# Patient Record
Sex: Male | Born: 1960
Health system: Southern US, Community
[De-identification: ages and names within clinical notes are randomized; demographics above are authoritative.]

## PROBLEM LIST (undated history)

## (undated) DIAGNOSIS — Z5189 Encounter for other specified aftercare: Secondary | ICD-10-CM

## (undated) DIAGNOSIS — J449 Chronic obstructive pulmonary disease, unspecified: Secondary | ICD-10-CM

## (undated) DIAGNOSIS — F1910 Other psychoactive substance abuse, uncomplicated: Secondary | ICD-10-CM

## (undated) DIAGNOSIS — E119 Type 2 diabetes mellitus without complications: Secondary | ICD-10-CM

## (undated) DIAGNOSIS — I10 Essential (primary) hypertension: Secondary | ICD-10-CM

## (undated) DIAGNOSIS — K219 Gastro-esophageal reflux disease without esophagitis: Secondary | ICD-10-CM

## (undated) DIAGNOSIS — M1990 Unspecified osteoarthritis, unspecified site: Secondary | ICD-10-CM

## (undated) DIAGNOSIS — G4733 Obstructive sleep apnea (adult) (pediatric): Secondary | ICD-10-CM

## (undated) DIAGNOSIS — M5136 Other intervertebral disc degeneration, lumbar region: Secondary | ICD-10-CM

## (undated) DIAGNOSIS — Z720 Tobacco use: Secondary | ICD-10-CM

## (undated) DIAGNOSIS — R0789 Other chest pain: Secondary | ICD-10-CM

## (undated) DIAGNOSIS — J069 Acute upper respiratory infection, unspecified: Secondary | ICD-10-CM

## (undated) DIAGNOSIS — J45909 Unspecified asthma, uncomplicated: Secondary | ICD-10-CM

## (undated) DIAGNOSIS — J939 Pneumothorax, unspecified: Secondary | ICD-10-CM

## (undated) DIAGNOSIS — T7840XA Allergy, unspecified, initial encounter: Secondary | ICD-10-CM

## (undated) DIAGNOSIS — L509 Urticaria, unspecified: Secondary | ICD-10-CM

## (undated) DIAGNOSIS — E785 Hyperlipidemia, unspecified: Secondary | ICD-10-CM

## (undated) DIAGNOSIS — I509 Heart failure, unspecified: Secondary | ICD-10-CM

## (undated) HISTORY — DX: Other intervertebral disc degeneration, lumbar region: M51.36

## (undated) HISTORY — DX: Heart failure, unspecified: I50.9

## (undated) HISTORY — DX: Gastro-esophageal reflux disease without esophagitis: K21.9

## (undated) HISTORY — DX: Allergy, unspecified, initial encounter: T78.40XA

## (undated) HISTORY — DX: Unspecified osteoarthritis, unspecified site: M19.90

## (undated) HISTORY — DX: Unspecified asthma, uncomplicated: J45.909

## (undated) HISTORY — DX: Other psychoactive substance abuse, uncomplicated: F19.10

## (undated) HISTORY — DX: Hyperlipidemia, unspecified: E78.5

## (undated) HISTORY — DX: Pneumothorax, unspecified: J93.9

## (undated) HISTORY — DX: Obstructive sleep apnea (adult) (pediatric): G47.33

## (undated) HISTORY — DX: Urticaria, unspecified: L50.9

## (undated) HISTORY — DX: Tobacco use: Z72.0

## (undated) HISTORY — DX: Other chest pain: R07.89

## (undated) HISTORY — PX: CHEST TUBE INSERTION: SHX231

## (undated) HISTORY — DX: Essential (primary) hypertension: I10

## (undated) HISTORY — DX: Acute upper respiratory infection, unspecified: J06.9

## (undated) HISTORY — DX: Encounter for other specified aftercare: Z51.89

## (undated) HISTORY — DX: Type 2 diabetes mellitus without complications: E11.9

## (undated) HISTORY — DX: Chronic obstructive pulmonary disease, unspecified: J44.9

---

## 2005-06-05 ENCOUNTER — Ambulatory Visit: Payer: Self-pay | Admitting: Cardiology

## 2006-09-18 ENCOUNTER — Ambulatory Visit: Payer: Self-pay | Admitting: Cardiology

## 2006-09-19 ENCOUNTER — Inpatient Hospital Stay (HOSPITAL_COMMUNITY): Admission: AD | Admit: 2006-09-19 | Discharge: 2006-09-22 | Payer: Self-pay | Admitting: Cardiology

## 2006-09-19 ENCOUNTER — Ambulatory Visit: Payer: Self-pay | Admitting: Cardiology

## 2007-07-06 ENCOUNTER — Ambulatory Visit: Payer: Self-pay | Admitting: Cardiology
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
## 2010-05-22 NOTE — Cardiovascular Report (Signed)
NAME:  Edward Burton, Edward Burton NO.:  [id number]   MEDICAL RECORD NO.:  [id number]          PATIENT TYPE:  INP   LOCATION:  2028                         FACILITY:  [facility]   PHYSICIAN:  Veverly Fells. Excell Seltzer, MD  DATE OF BIRTH:  [birth year]   DATE OF PROCEDURE:  DATE OF DISCHARGE:                            CARDIAC CATHETERIZATION   PROCEDURE:  Left heart catheterization, selective coronary angiography,  left ventricular angiography, StarClose of the right femoral artery.   INDICATION:  Edward Burton is a 50 year old obese gentleman who presented  with chest pain.  He was initially evaluated at [facility].  He has  multiple cardiac risk factors including ongoing tobacco and obesity with  hypertension in the background.  He underwent a Myoview study that  showed inferior ischemia.  He was referred for cardiac catheterization.   Risks and indications of the procedure were reviewed with the patient.  Informed consent was obtained.  The right groin was accessed using the  modified Seldinger technique and a 6-French sheath was placed.  Multiple  views of the coronaries were taken using standard Judkins catheters.  A  pigtail catheter was inserted into the left ventricle where pressures  were recorded.  A left ventriculogram was done.  A pullback across the  aortic valve was performed.  At the completion of the procedure a  StarClose device was used to seal the femoral arteriotomy.  There were  no immediate complications.   FINDINGS:  Aortic pressure 127/83 with a mean of 102, left ventricular  pressure 125/17.  There was no aortic valve gradient.   CORONARY ANGIOGRAPHY:  The left mainstem is angiographically normal.  It  trifurcates into the LAD, intermediate branch, and left circumflex.   The LAD is a large-caliber vessel that wraps around the LV apex.  The  LAD courses down and supplies two septal perforators and one medium-size  diagonal branch.  There is no significant  angiographic stenosis  throughout the LAD.  The LAD wraps around the apex and supplies much of  the inferoapical wall.   There is a very large intermediate branch that is widely patent.  It  bifurcates into twin vessels and supplies much of the inferolateral  wall.   The left circumflex is small.  There is a very small first OM and a  small second OM branch.  There is no significant angiographic stenosis.   The right coronary artery is dominant but the PDA is small.  There is  also a small right posterolateral branch.  There is an RV marginal  branch that originates from the mid portion of the right coronary  artery.  There is no significant angiographic stenosis throughout the  right coronary artery.   Left ventricular function is at the low levels of normal.  The LVEF is  estimated at 50-55%.  There is no mitral regurgitation.   ASSESSMENT:  1. Normal coronary arteries.  2. Low-normal left ventricular systolic function.   Edward Burton likely had noncardiac chest pain.  He will benefit from  primary risk reduction with weight loss and smoking cessation.  We will  plan on discharge  home later today.      Veverly Fells. Excell Seltzer, MD  Electronically Signed     MDC/MEDQ  D:  09/22/2006  T:  09/22/2006  Job:  [id number]

## 2010-10-19 LAB — CBC
MCHC: 33.2
MCV: 84.8
RBC: 4.4

## 2011-07-12 DIAGNOSIS — E1169 Type 2 diabetes mellitus with other specified complication: Secondary | ICD-10-CM | POA: Insufficient documentation

## 2011-07-12 DIAGNOSIS — E785 Hyperlipidemia, unspecified: Secondary | ICD-10-CM | POA: Insufficient documentation

## 2011-09-28 DIAGNOSIS — F419 Anxiety disorder, unspecified: Secondary | ICD-10-CM | POA: Insufficient documentation

## 2012-04-14 DIAGNOSIS — R079 Chest pain, unspecified: Secondary | ICD-10-CM

## 2012-04-20 ENCOUNTER — Encounter: Payer: Self-pay | Admitting: Cardiology

## 2012-04-23 ENCOUNTER — Encounter: Payer: Self-pay | Admitting: *Deleted

## 2012-04-23 ENCOUNTER — Encounter: Payer: Self-pay | Admitting: Physician Assistant

## 2012-04-23 NOTE — Progress Notes (Signed)
Patient walked into office saying he forgot about his 1:40 pm appointment today and wanted to be seen now for chest pain. Patient stated he was actively having chest pain. Patient said that he didn't take nitro because it makes him feel worse. Nurse asked patient what number her rated his pain on a scale of 1-10 ( 10 being the greatest) and patient said he didn't know. Nurse informed patient that no provider was here to see him in the office now and he needed to go to ED for evaluation. Patient stated that he just felt so bad, and nurse offered to call EMS and patient declined. Nurse offered to patient to reschedule his missed appointment and patient declined on that also.
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
## 2012-04-23 NOTE — Telephone Encounter (Signed)
This encounter was created in error - please disregard.

## 2013-03-15 ENCOUNTER — Encounter (HOSPITAL_COMMUNITY): Payer: Self-pay | Admitting: Emergency Medicine

## 2013-03-15 ENCOUNTER — Emergency Department (HOSPITAL_COMMUNITY)
Admission: EM | Admit: 2013-03-15 | Discharge: 2013-03-15 | Disposition: A | Discharge status: Home / Self Care | Payer: PRIVATE HEALTH INSURANCE | Attending: Emergency Medicine | Admitting: Emergency Medicine

## 2013-03-15 ENCOUNTER — Emergency Department (HOSPITAL_COMMUNITY): Payer: PRIVATE HEALTH INSURANCE

## 2013-03-15 DIAGNOSIS — Z8719 Personal history of other diseases of the digestive system: Secondary | ICD-10-CM | POA: Insufficient documentation

## 2013-03-15 DIAGNOSIS — J069 Acute upper respiratory infection, unspecified: Secondary | ICD-10-CM | POA: Insufficient documentation

## 2013-03-15 DIAGNOSIS — Z880 Allergy status to penicillin: Secondary | ICD-10-CM | POA: Insufficient documentation

## 2013-03-15 DIAGNOSIS — I10 Essential (primary) hypertension: Secondary | ICD-10-CM | POA: Insufficient documentation

## 2013-03-15 DIAGNOSIS — F17200 Nicotine dependence, unspecified, uncomplicated: Secondary | ICD-10-CM | POA: Insufficient documentation

## 2013-03-15 DIAGNOSIS — N390 Urinary tract infection, site not specified: Secondary | ICD-10-CM | POA: Insufficient documentation

## 2013-03-15 DIAGNOSIS — Z9889 Other specified postprocedural states: Secondary | ICD-10-CM | POA: Insufficient documentation

## 2013-03-15 DIAGNOSIS — E119 Type 2 diabetes mellitus without complications: Secondary | ICD-10-CM | POA: Insufficient documentation

## 2013-03-15 LAB — COMPREHENSIVE METABOLIC PANEL
ALK PHOS: 55 U/L (ref 39–117)
ALT: 17 U/L (ref 0–53)
AST: 23 U/L (ref 0–37)
Albumin: 3.7 g/dL (ref 3.5–5.2)
BILIRUBIN TOTAL: 0.6 mg/dL (ref 0.3–1.2)
BUN: 17 mg/dL (ref 6–23)
CHLORIDE: 104 meq/L (ref 96–112)
CO2: 29 meq/L (ref 19–32)
Calcium: 9.1 mg/dL (ref 8.4–10.5)
Creatinine, Ser: 1.05 mg/dL (ref 0.50–1.35)
GFR, EST NON AFRICAN AMERICAN: 80 mL/min — AB (ref >90)
GLUCOSE: 89 mg/dL (ref 70–99)
POTASSIUM: 3.8 meq/L (ref 3.7–5.3)
SODIUM: 142 meq/L (ref 137–147)
TOTAL PROTEIN: 7.5 g/dL (ref 6.0–8.3)

## 2013-03-15 LAB — CBC WITH DIFFERENTIAL/PLATELET
Basophils Absolute: 0 10*3/uL (ref 0.0–0.1)
Basophils Relative: 0 % (ref 0–1)
Eosinophils Absolute: 0.1 10*3/uL (ref 0.0–0.7)
Eosinophils Relative: 4 % (ref 0–5)
HCT: 41.7 % (ref 39.0–52.0)
HEMOGLOBIN: 13.6 g/dL (ref 13.0–17.0)
LYMPHS ABS: 1.4 10*3/uL (ref 0.7–4.0)
LYMPHS PCT: 35 % (ref 12–46)
MCH: 28.9 pg (ref 26.0–34.0)
MCHC: 32.6 g/dL (ref 30.0–36.0)
MCV: 88.5 fL (ref 78.0–100.0)
MONOS PCT: 9 % (ref 3–12)
Monocytes Absolute: 0.4 10*3/uL (ref 0.1–1.0)
NEUTROS ABS: 2.1 10*3/uL (ref 1.7–7.7)
NEUTROS PCT: 52 % (ref 43–77)
PLATELETS: 153 10*3/uL (ref 150–400)
RBC: 4.71 MIL/uL (ref 4.22–5.81)
RDW: 13.5 % (ref 11.5–15.5)
WBC: 4 10*3/uL (ref 4.0–10.5)

## 2013-03-15 LAB — URINALYSIS, ROUTINE W REFLEX MICROSCOPIC
Glucose, UA: NEGATIVE mg/dL
HGB URINE DIPSTICK: NEGATIVE
Ketones, ur: NEGATIVE mg/dL
Leukocytes, UA: NEGATIVE
NITRITE: NEGATIVE
PH: 6 (ref 5.0–8.0)
UROBILINOGEN UA: 0.2 mg/dL (ref 0.0–1.0)

## 2013-03-15 LAB — URINE MICROSCOPIC-ADD ON

## 2013-03-15 MED ORDER — CIPROFLOXACIN HCL 500 MG PO TABS: 500.0000 mg | ORAL_TABLET | Freq: Two times a day (BID) | ORAL | Status: DC

## 2013-03-15 NOTE — Discharge Instructions (Signed)
Follow up next week for recheck if not improving.

## 2013-03-15 NOTE — ED Notes (Signed)
Pt alert & oriented x4, stable gait. Patient given discharge instructions, paperwork & prescription(s). Patient  instructed to stop at the registration desk to finish any additional paperwork. Patient verbalized understanding. Pt left department w/ no further questions. 
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
## 2013-03-15 NOTE — ED Notes (Signed)
Pt states pain to lower abdomen, lower back, and groin area, worse with urination. Pt also states urinary incontinence at times. Symptoms x 2-3 weeks, stating he first thought he had the flu. Pt states he stopped taking blood pressure medication and metformin a year ago on his own.

## 2013-03-24 ENCOUNTER — Emergency Department (HOSPITAL_COMMUNITY): Payer: PRIVATE HEALTH INSURANCE

## 2013-03-24 ENCOUNTER — Encounter (HOSPITAL_COMMUNITY): Payer: Self-pay | Admitting: Emergency Medicine

## 2013-03-24 ENCOUNTER — Emergency Department (HOSPITAL_COMMUNITY)
Admission: EM | Admit: 2013-03-24 | Discharge: 2013-03-24 | Disposition: A | Discharge status: Home / Self Care | Payer: PRIVATE HEALTH INSURANCE | Attending: Emergency Medicine | Admitting: Emergency Medicine

## 2013-03-24 DIAGNOSIS — Z79899 Other long term (current) drug therapy: Secondary | ICD-10-CM | POA: Insufficient documentation

## 2013-03-24 DIAGNOSIS — J449 Chronic obstructive pulmonary disease, unspecified: Secondary | ICD-10-CM

## 2013-03-24 DIAGNOSIS — I10 Essential (primary) hypertension: Secondary | ICD-10-CM | POA: Insufficient documentation

## 2013-03-24 DIAGNOSIS — R3919 Other difficulties with micturition: Secondary | ICD-10-CM | POA: Insufficient documentation

## 2013-03-24 DIAGNOSIS — E119 Type 2 diabetes mellitus without complications: Secondary | ICD-10-CM | POA: Insufficient documentation

## 2013-03-24 DIAGNOSIS — Z8719 Personal history of other diseases of the digestive system: Secondary | ICD-10-CM | POA: Insufficient documentation

## 2013-03-24 DIAGNOSIS — Z880 Allergy status to penicillin: Secondary | ICD-10-CM | POA: Insufficient documentation

## 2013-03-24 DIAGNOSIS — M549 Dorsalgia, unspecified: Secondary | ICD-10-CM | POA: Insufficient documentation

## 2013-03-24 DIAGNOSIS — J441 Chronic obstructive pulmonary disease with (acute) exacerbation: Secondary | ICD-10-CM | POA: Insufficient documentation

## 2013-03-24 DIAGNOSIS — F17200 Nicotine dependence, unspecified, uncomplicated: Secondary | ICD-10-CM | POA: Insufficient documentation

## 2013-03-24 LAB — URINALYSIS, ROUTINE W REFLEX MICROSCOPIC
BILIRUBIN URINE: NEGATIVE
GLUCOSE, UA: NEGATIVE mg/dL
HGB URINE DIPSTICK: NEGATIVE
Ketones, ur: NEGATIVE mg/dL
Leukocytes, UA: NEGATIVE
Nitrite: NEGATIVE
PROTEIN: NEGATIVE mg/dL
Specific Gravity, Urine: 1.025 (ref 1.005–1.030)
Urobilinogen, UA: 0.2 mg/dL (ref 0.0–1.0)
pH: 6.5 (ref 5.0–8.0)

## 2013-03-24 MED ORDER — PREDNISONE 50 MG PO TABS
60.0000 mg | ORAL_TABLET | Freq: Once | ORAL | Status: AC
  Administered 2013-03-24: 60 mg via ORAL
  Filled 2013-03-24 (×2): qty 1

## 2013-03-24 MED ORDER — PREDNISONE 10 MG PO TABS: 40.0000 mg | ORAL_TABLET | Freq: Every day | ORAL | Status: DC

## 2013-03-24 MED ORDER — IPRATROPIUM-ALBUTEROL 0.5-2.5 (3) MG/3ML IN SOLN: 3.0000 mL | Freq: Once | RESPIRATORY_TRACT | Status: DC

## 2013-03-24 MED ORDER — ALBUTEROL SULFATE HFA 108 (90 BASE) MCG/ACT IN AERS
2.0000 | INHALATION_SPRAY | Freq: Four times a day (QID) | RESPIRATORY_TRACT | Status: DC
  Filled 2013-03-24: qty 6.7

## 2013-03-24 MED ORDER — HYDROMORPHONE HCL PF 1 MG/ML IJ SOLN: 0.5000 mg | Freq: Once | INTRAMUSCULAR | Status: DC

## 2013-03-24 MED ORDER — VANCOMYCIN HCL IN DEXTROSE 1-5 GM/200ML-% IV SOLN: 1000.0000 mg | Freq: Once | INTRAVENOUS | Status: DC

## 2013-03-24 MED ORDER — IPRATROPIUM-ALBUTEROL 0.5-2.5 (3) MG/3ML IN SOLN
3.0000 mL | Freq: Once | RESPIRATORY_TRACT | Status: AC
  Administered 2013-03-24: 3 mL via RESPIRATORY_TRACT
  Filled 2013-03-24: qty 3

## 2013-03-24 MED ORDER — AZTREONAM 2 G IJ SOLR: 2.0000 g | Freq: Once | INTRAMUSCULAR | Status: DC

## 2013-03-24 NOTE — Discharge Instructions (Signed)
Asthma, Adult Asthma is a condition of the lungs in which the airways tighten and narrow. Asthma can make it hard to breathe. Asthma cannot be cured, but medicine and lifestyle changes can help control it. Asthma may be started (triggered) by:  Animal skin flakes (dander).  Dust.  Cockroaches.  Pollen.  Mold.  Smoke.  Cleaning products.  Hair sprays or aerosol sprays.  Paint fumes or strong smells.  Cold air, weather changes, and winds.  Crying or laughing hard.  Stress.  Certain medicines or drugs.  Foods, such as dried fruit, potato chips, and sparkling grape juice.  Infections or conditions (colds, flu).  Exercise.  Certain medical conditions or diseases.  Exercise or tiring activities. HOME CARE   Take medicine as told by your doctor.  Use a peak flow meter as told by your doctor. A peak flow meter is a tool that measures how well the lungs are working.  Record and keep track of the peak flow meter's readings.  Understand and use the asthma action plan. An asthma action plan is a written plan for taking care of your asthma and treating your attacks.  To help prevent asthma attacks:  Do not smoke. Stay away from secondhand smoke.  Change your heating and air conditioning filter often.  Limit your use of fireplaces and wood stoves.  Get rid of pests (such as roaches and mice) and their droppings.  Throw away plants if you see mold on them.  Clean your floors. Dust regularly. Use cleaning products that do not smell.  Have someone vacuum when you are not home. Use a vacuum cleaner with a HEPA filter if possible.  Replace carpet with wood, tile, or vinyl flooring. Carpet can trap animal skin flakes and dust.  Use allergy-proof pillows, mattress covers, and box spring covers.  Wash bed sheets and blankets every week in hot water and dry them in a dryer.  Use blankets that are made of polyester or cotton.  Clean bathrooms and kitchens with bleach.  If possible, have someone repaint the walls in these rooms with mold-resistant paint. Keep out of the rooms that are being cleaned and painted.  Wash hands often. GET HELP IF:  You have make a whistling sound when breaking (wheeze), have shortness of breath, or have a cough even if taking medicine to prevent attacks.  The colored mucus you cough up (sputum) is thicker than usual.  The colored mucus you cough up changes from clear or white to yellow, green, gray, or bloody.  You have problems from the medicine you are taking such as:  A rash.  Itching.  Swelling.  Trouble breathing.  You need reliever medicines more than 2 3 times a week.  Your peak flow measurement is still at 50 79% of your personal best after following the action plan for 1 hour. GET HELP RIGHT AWAY IF:   You seem to be worse and are not responding to medicine during an asthma attack.  You are short of breath even at rest.  You get short of breath when doing very little activity.  You have trouble eating, drinking, or talking.  You have chest pain.  You have a fast heartbeat.  Your lips or fingernails start to turn blue.  You are lightheaded, dizzy, or faint.  Your peak flow is less than 50% of your personal best.  You have a fever or lasting symptoms for more than 2 3 days.  You have a fever and your symptoms suddenly  get worse. MAKE SURE YOU:   Understand these instructions.  Will watch your condition.  Will get help right away if you are not doing well or get worse. Document Released: 06/12/2007 Document Revised: 10/14/2012 Document Reviewed: 07/23/2012 [facility] Patient Information 2014 [place], [place].   Emergency Department Resource Guide 1) Find a Doctor and Pay Out of Pocket Although you won't have to find out who is covered by your insurance plan, it is a good idea to ask around and get recommendations. You will then need to call the office and see if the doctor you have chosen  will accept you as a new patient and what types of options they offer for patients who are self-pay. Some doctors offer discounts or will set up payment plans for their patients who do not have insurance, but you will need to ask so you aren't surprised when you get to your appointment.  2) Contact Your Local Health Department Not all health departments have doctors that can see patients for sick visits, but many do, so it is worth a call to see if yours does. If you don't know where your local health department is, you can check in your phone book. The CDC also has a tool to help you locate your state's health department, and many state websites also have listings of all of their local health departments.  3) Find a [facility] If your illness is not likely to be very severe or complicated, you may want to try a walk in clinic. These are popping up all over the country in pharmacies, drugstores, and shopping centers. They're usually staffed by nurse practitioners or physician assistants that have been trained to treat common illnesses and complaints. They're usually fairly quick and inexpensive. However, if you have serious medical issues or chronic medical problems, these are probably not your best option.  No Primary Care Doctor: - Call Health Connect at  [phone] - they can help you locate a primary care doctor that  accepts your insurance, provides certain services, etc. - Physician Referral Service- [phone]  Chronic Pain Problems: Organization         Address  Phone   Notes  [facility]  [phone] Patients need to be referred by their primary care doctor.   Medication Assistance: Organization         Address  Phone   Notes  [facility] Medication [facility] [address] [place], [place] [address] [phone] --Must be a resident of [facility] -- Must have NO insurance coverage whatsoever (no Medicaid/ Medicare, etc.) --  The pt. MUST have a primary care doctor that directs their care regularly and follows them in the community   Med[facility]ist  [phone]   [employer]  [phone]    Agencies that provide inexpensive medical care: Organization         Address  Phone   Notes  [place]  [phone]   [facility]    [phone]   [facility] [place], [place] [address] [phone]   [place] [address], [place] [phone]   Planned Parenthood    [phone]   [facility]    [phone]   [place] and [place] [address], [place] Phone:  [phone], Fax:  [phone] Hours of Operation:  9 am - 6 pm,  M-F.  Also accepts Medicaid/Medicare and self-pay.  [facility] for [address] [place], [address], [place] Phone: [phone], Fax: [phone]. Hours of Operation:  8:30 am - 5:30 pm, M-F.  Also accepts Medicaid and self-pay.  [facility] [address] [place] Phone: [phone]   [place], [place], [place] [phone], Ext. [phone] Mondays & Thursdays: 7-9 AM.  First 15 patients are seen on a first come, first serve basis.    [place] Providers:  Organization         Address  Phone   Notes  [facility] [address], [place] [phone] Also accepts self-pay patients.  [facility] [id number] [place], [place]  [phone]   [place], [address], [place] [phone]   [facility] [address], [place] [phone]   Lucianne Lei [address], [place]   [phone] Only accepts [place] Access [place] patients after they have their name applied to their card.   Self-Pay (no insurance)  in [facility]:  Organization         Address  Phone   Notes  Sickle Cell Patients, [facility] [place] [phone]   [facility] [place] [phone]   [facility] [place]  [place], [place], [place] [phone]   [facility]/Dr. Osei-Bonsu  [address] [place] or [address], [place] [phone] Phone number for both [facility] and [place] locations is the same.  Urgent Medical and [facility] [address] [place] [phone]   [facility] [address] [place] or [address] [phone]   [facility] [address] [place] [phone], phone; [phone], fax Sees patients 1st and 3rd Saturday of every month.  Must not qualify for public or private insurance (i.e. Medicaid, Medicare, [place] Health Choice, Veterans' Benefits)  Household income should be no more than 200% of the poverty level The clinic cannot treat you if you are pregnant or think you are pregnant  Sexually transmitted diseases are not treated at the clinic.    Dental Care: Organization         Address  Phone  Notes  [facility] of [facility] [place] [phone] Accepts children up to age 66 who are enrolled in [place] or [place]; pregnant women with a Medicaid card; and children who have applied for Medicaid or [place] Health Choice, but were declined, whose parents can pay a reduced fee at time of service.  [facility]  [address], [place] [phone] Accepts children up to age 46 who are enrolled in [place] or [place]; pregnant women with a Medicaid card; and children who have applied for Medicaid or [place] Health Choice, but were declined, whose  parents can pay a reduced fee at time of service.  [facility] Adult Dental Access PROGRAM  [place] [phone] Patients are seen by appointment only. Walk-ins are not accepted. [place] will see patients 65 years of age and older. Monday -  Tuesday (8am-5pm) Most Wednesdays (8:30-5pm) $30 per visit, cash only  [facility] Adult Dental Access PROGRAM  [address], [facility] [phone] Patients are seen by appointment only. Walk-ins are not accepted. [place] will see patients 56 years of age and older. One Wednesday Evening (Monthly: Volunteer Based).  $30 per visit, cash only  [place]  [phone] for adults; Children under age 4, call Graduate Pediatric Dentistry at [phone]. Children aged 74-14, please call [phone] to request a pediatric application.  Dental services are provided in all areas of dental care including fillings, crowns and bridges, complete and partial dentures, implants, gum treatment, root canals, and extractions. Preventive care is also provided. Treatment is provided to both adults and children. Patients are selected via a lottery and there is often a waiting list.   [facility] [address] [place]  [phone] [email or web address]   [facility] [address] [place], [place] [phone], Ext. [phone] Second and Fourth Thursday of each month, opens at 6:30 AM; Clinic ends at 9 AM.  Patients are seen on a first-come first-served basis, and a limited number are seen during each clinic.   [facility]  [address] Hillard Danker [place], [place] [phone]   Eligibility Requirements You must have lived in [place], [place], or [place] counties for at least the last three months.   You cannot be eligible for state or federal sponsored [employer], including [employer], [place], or [employer].   You generally cannot be eligible for  healthcare insurance through your employer.    How to apply: Eligibility screenings are held every Tuesday and Wednesday afternoon from 1:00 pm until 4:00 pm. You do not need an appointment for the interview!  [facility] [address], [place], [place]   [place]  [place] Department  [place]  [phone]    Behavioral Health Resources in the Community: Intensive Outpatient Programs Organization         Address  Phone  Notes  [place] [place], [place] [phone]   [facility] Outpatient [address], [place], [place]   ADS: Alcohol & Drug Svcs [address] [place], [place]   [place] [address],  [place], [place] or [phone]   Substance Abuse Resources Organization         Address  Phone  Notes  Alcohol and Drug Services  [phone]   [place]  [phone]   [facility] [place]   Chinita Pester  [phone]   Residential & Outpatient Substance Abuse Program  [phone]   Psychological Services Organization         Address  Phone  Notes  [facility]  [place]  [phone]   [place] [address], [place] or [phone]    Mobile Crisis Teams Organization         Address  Phone  Notes  [facility], Mobile Crisis Care Unit  [phone]   [facility]  [address] [place], [place]   [place] [address] [place] [phone]    Self-Help/Support Groups Organization         Address  Phone  Notes  Mental Health Assoc. of [place] - variety of support groups  [place] Call for more information  Narcotics  Anonymous (NA), [facility]aring [facility] [address], [employer] [place]  2 meetings at this location   Special educational needs teacher         Address  Phone  Notes  [facility] Residential Treatment [place],    [place]  [phone]   [facility]  [address] [place] [address]191, [place], [place]   [place], [place] [phone] Admissions: 8am-3pm M-F  Incentives Substance [place] [address],    [place], [place] [phone]   [facility] [address] [place], [place], [place]   [facility] [address],  [place], [place]   [facility] Programs - Intensive Outpatient [address]een [address], [place], [place]   [facility] [place].,  [place], [place] [phone] or [phone]   [facility] Services (RTS) [address] [place], [place] Accepts Medicaid  [facility] [place] [address],  [place] [phone] Substance Abuse/Addiction Treatment   [facility] Organization         Address  Phone  Notes  [facility]  [phone]   Domenic Schwab, PhD [address] Arlis Porta [place], [place]   [phone] or [phone]   [place], [place] [phone]   [facility] [address] [place], [place] [phone] Insurance/Medicaid/sponsorship through [facility] and Families [address]                                    [place], [place] [phone] [place] [address], [place] [phone]    Dr. Adele Schilder  [phone]   [facility] Dept. 1) [address] [place] 2) [place] 3)  [place] [address], [place] [phone]  [phone]   [place] [phone] or [phone] (After Hours)     Use resource guide above to help find a record Dr. Usual albuterol inhaler 2 puffs every 6 hours next 7 days then as needed for shortness of breath. Take prednisone for the next 5 days. Return for any new or worse symptoms.

## 2013-03-24 NOTE — ED Notes (Signed)
Poor historian states he has trouble breathing and urinary problems
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
## 2013-03-24 NOTE — ED Notes (Signed)
Patient walking around hall in front of his room.  Patient states he feels better.  Patient awaiting discharge.

## 2013-08-20 ENCOUNTER — Encounter (HOSPITAL_COMMUNITY): Payer: Self-pay | Admitting: Emergency Medicine

## 2013-08-20 ENCOUNTER — Emergency Department (HOSPITAL_COMMUNITY): Payer: PRIVATE HEALTH INSURANCE

## 2013-08-20 ENCOUNTER — Emergency Department (HOSPITAL_COMMUNITY)
Admission: EM | Admit: 2013-08-20 | Discharge: 2013-08-20 | Disposition: A | Discharge status: Home / Self Care | Payer: PRIVATE HEALTH INSURANCE | Attending: Emergency Medicine | Admitting: Emergency Medicine

## 2013-08-20 DIAGNOSIS — I10 Essential (primary) hypertension: Secondary | ICD-10-CM | POA: Insufficient documentation

## 2013-08-20 DIAGNOSIS — Z8719 Personal history of other diseases of the digestive system: Secondary | ICD-10-CM | POA: Diagnosis not present

## 2013-08-20 DIAGNOSIS — R079 Chest pain, unspecified: Secondary | ICD-10-CM | POA: Diagnosis present

## 2013-08-20 DIAGNOSIS — R0789 Other chest pain: Secondary | ICD-10-CM

## 2013-08-20 DIAGNOSIS — R071 Chest pain on breathing: Secondary | ICD-10-CM | POA: Insufficient documentation

## 2013-08-20 DIAGNOSIS — Z8709 Personal history of other diseases of the respiratory system: Secondary | ICD-10-CM | POA: Diagnosis not present

## 2013-08-20 DIAGNOSIS — Z7982 Long term (current) use of aspirin: Secondary | ICD-10-CM | POA: Insufficient documentation

## 2013-08-20 DIAGNOSIS — Z79899 Other long term (current) drug therapy: Secondary | ICD-10-CM | POA: Insufficient documentation

## 2013-08-20 DIAGNOSIS — Z880 Allergy status to penicillin: Secondary | ICD-10-CM | POA: Insufficient documentation

## 2013-08-20 DIAGNOSIS — E119 Type 2 diabetes mellitus without complications: Secondary | ICD-10-CM | POA: Diagnosis not present

## 2013-08-20 DIAGNOSIS — F17200 Nicotine dependence, unspecified, uncomplicated: Secondary | ICD-10-CM | POA: Insufficient documentation

## 2013-08-20 DIAGNOSIS — Z87828 Personal history of other (healed) physical injury and trauma: Secondary | ICD-10-CM | POA: Insufficient documentation

## 2013-08-20 LAB — CBC WITH DIFFERENTIAL/PLATELET
BASOS PCT: 0 % (ref 0–1)
Basophils Absolute: 0 10*3/uL (ref 0.0–0.1)
Eosinophils Absolute: 0.1 10*3/uL (ref 0.0–0.7)
Eosinophils Relative: 2 % (ref 0–5)
HEMATOCRIT: 43.2 % (ref 39.0–52.0)
HEMOGLOBIN: 13.9 g/dL (ref 13.0–17.0)
LYMPHS PCT: 32 % (ref 12–46)
Lymphs Abs: 1.9 10*3/uL (ref 0.7–4.0)
MCH: 28.7 pg (ref 26.0–34.0)
MCHC: 32.2 g/dL (ref 30.0–36.0)
MCV: 89.3 fL (ref 78.0–100.0)
MONO ABS: 0.4 10*3/uL (ref 0.1–1.0)
MONOS PCT: 7 % (ref 3–12)
NEUTROS ABS: 3.5 10*3/uL (ref 1.7–7.7)
Neutrophils Relative %: 59 % (ref 43–77)
Platelets: 168 10*3/uL (ref 150–400)
RBC: 4.84 MIL/uL (ref 4.22–5.81)
RDW: 13.6 % (ref 11.5–15.5)
WBC: 6 10*3/uL (ref 4.0–10.5)

## 2013-08-20 LAB — BASIC METABOLIC PANEL
Anion gap: 9 (ref 5–15)
BUN: 13 mg/dL (ref 6–23)
CHLORIDE: 106 meq/L (ref 96–112)
CO2: 27 meq/L (ref 19–32)
CREATININE: 1.09 mg/dL (ref 0.50–1.35)
Calcium: 8.6 mg/dL (ref 8.4–10.5)
GFR calc Af Amer: 88 mL/min — ABNORMAL LOW (ref >90)
GFR calc non Af Amer: 76 mL/min — ABNORMAL LOW (ref >90)
Glucose, Bld: 85 mg/dL (ref 70–99)
Potassium: 4 mEq/L (ref 3.7–5.3)
Sodium: 142 mEq/L (ref 137–147)

## 2013-08-20 LAB — TROPONIN I

## 2013-08-20 LAB — D-DIMER, QUANTITATIVE (NOT AT ARMC): D-Dimer, Quant: 1.02 ug/mL-FEU — ABNORMAL HIGH (ref 0.00–0.48)

## 2013-08-20 MED ORDER — ONDANSETRON HCL 4 MG/2ML IJ SOLN
4.0000 mg | Freq: Once | INTRAMUSCULAR | Status: DC
  Filled 2013-08-20: qty 2

## 2013-08-20 MED ORDER — HYDROCODONE-ACETAMINOPHEN 5-325 MG PO TABS: 2.0000 | ORAL_TABLET | ORAL | Status: DC | PRN

## 2013-08-20 MED ORDER — ONDANSETRON HCL 4 MG/2ML IJ SOLN
4.0000 mg | Freq: Once | INTRAMUSCULAR | Status: AC
  Administered 2013-08-20: 4 mg via INTRAVENOUS

## 2013-08-20 MED ORDER — IOHEXOL 350 MG/ML SOLN
100.0000 mL | Freq: Once | INTRAVENOUS | Status: AC | PRN
  Administered 2013-08-20: 100 mL via INTRAVENOUS

## 2013-08-20 MED ORDER — HYDROCODONE-ACETAMINOPHEN 5-325 MG PO TABS: 1.0000 | ORAL_TABLET | ORAL | Status: DC | PRN

## 2013-08-20 MED ORDER — MORPHINE SULFATE 4 MG/ML IJ SOLN
4.0000 mg | Freq: Once | INTRAMUSCULAR | Status: AC
  Administered 2013-08-20: 4 mg via INTRAVENOUS
  Filled 2013-08-20: qty 1

## 2013-08-20 NOTE — Discharge Instructions (Signed)
Chest Wall Pain °Chest wall pain is pain in or around the bones and muscles of your chest. It may take up to 6 weeks to get better. It may take longer if you must stay physically active in your work and activities.  °CAUSES  °Chest wall pain may happen on its own. However, it may be caused by: °· A viral illness like the flu. °· Injury. °· Coughing. °· Exercise. °· Arthritis. °· Fibromyalgia. °· Shingles. °HOME CARE INSTRUCTIONS  °· Avoid overtiring physical activity. Try not to strain or perform activities that cause pain. This includes any activities using your chest or your abdominal and side muscles, especially if heavy weights are used. °· Put ice on the sore area. °¨ Put ice in a plastic bag. °¨ Place a towel between your skin and the bag. °¨ Leave the ice on for 15-20 minutes per hour while awake for the first 2 days. °· Only take over-the-counter or prescription medicines for pain, discomfort, or fever as directed by your caregiver. °SEEK IMMEDIATE MEDICAL CARE IF:  °· Your pain increases, or you are very uncomfortable. °· You have a fever. °· Your chest pain becomes worse. °· You have new, unexplained symptoms. °· You have nausea or vomiting. °· You feel sweaty or lightheaded. °· You have a cough with phlegm (sputum), or you cough up blood. °MAKE SURE YOU:  °· Understand these instructions. °· Will watch your condition. °· Will get help right away if you are not doing well or get worse. °Document Released: 12/24/2004 Document Revised: 03/18/2011 Document Reviewed: 08/20/2010 °ExitCare® Patient Information ©2015 [employer]. This information is not intended to replace advice given to you by your health care provider. Make sure you discuss any questions you have with your health care provider. ° ° °Emergency Department Resource Guide °1) Find a Doctor and Pay Out of Pocket °Although you won't have to find out who is covered by your insurance plan, it is a good idea to ask around and get recommendations. You  will then need to call the office and see if the doctor you have chosen will accept you as a new patient and what types of options they offer for patients who are self-pay. Some doctors offer discounts or will set up payment plans for their patients who do not have insurance, but you will need to ask so you aren't surprised when you get to your appointment. ° °2) Contact Your Local Health Department °Not all health departments have doctors that can see patients for sick visits, but many do, so it is worth a call to see if yours does. If you don't know where your local health department is, you can check in your phone book. The CDC also has a tool to help you locate your state's health department, and many state websites also have listings of all of their local health departments. ° °3) Find a Walk-in Clinic °If your illness is not likely to be very severe or complicated, you may want to try a walk in clinic. These are popping up all over the country in pharmacies, drugstores, and shopping centers. They're usually staffed by nurse practitioners or physician assistants that have been trained to treat common illnesses and complaints. They're usually fairly quick and inexpensive. However, if you have serious medical issues or chronic medical problems, these are probably not your best option. ° °No Primary Care Doctor: °- Call [facility] at  832-8000 - they can help you locate a primary care doctor that    accepts your insurance, provides certain services, etc. °- Physician Referral Service- 1-800-533-3463 ° °Chronic Pain Problems: °Organization         Address  Phone   Notes  °[facility]  (336) 297-2271 Patients need to be referred by their primary care doctor.  ° °Medication Assistance: °Organization         Address  Phone   Notes  °[place] [facility] [address] °[place], [place] [address] (336) 641-8030 --Must be a resident of [place] °-- Must  have NO insurance coverage whatsoever (no Medicaid/ Medicare, etc.) °-- The pt. MUST have a primary care doctor that directs their care regularly and follows them in the community °  °[facility]  (866) 331-1348   °[employer]  (888) 892-1162   ° °Agencies that provide inexpensive medical care: °Organization         Address  Phone   Notes  °[facility]  (336) 832-8035   °[facility]    (336) 832-7272   °[facility] [address] °[place], [place] [address] (336) 832-4777   °[facility] [address], °[place] (336) 271-4999   °[facility]    (336) 373-0678   °[facility]    (336) 272-1050   °[facility] ° [address], [place] Phone:  (336) 832-4444, Fax:  (336) 832-4440 Hours of Operation:  9 am - 6 pm, M-F.  Also accepts Medicaid/Medicare and self-pay.  °[facility] ° [address], [place] Phone: (336) 832-3150, Fax: (336) 832-3151. Hours of Operation:  8:30 am - 5:30 pm, M-F.  Also accepts Medicaid and self-pay.  °[facility] [place] [address], [place] Phone: (336) 878-6027   °[facility] [address], [place], [place] (336)723-1848, Ext. 123 Mondays & Thursdays: 7-9 AM.  First 15 patients are seen on a first come, first serve basis. °  ° °Medicaid-accepting [place] Providers: ° °Organization         Address  Phone   Notes  °[facility] [address], [place] (336) 641-2100 Also accepts self-pay patients.  °[facility] [address], [place] ° (336) 856-9996   °[facility] [address], [place] (336) 288-8857   °[facility] [address] [place] [address], [place] (336) 299-7000   °Veita Bland [address], [place]  ° (336) 373-1557 Only accepts [place] Access Medicaid patients after  they have their name applied to their card.  ° °Self-Pay (no insurance) in [place]: ° °Organization         Address  Phone   Notes  °Sickle Cell Patients, [facility] [address], [place] (336) 832-1970   °[facility] [address], [place] (336) 832-4400   °[facility] [place] [facility] ° [address], [place] (336) 992-4800   °[facility]/Dr. Osei-Bonsu ° [address] [place] [address], [place] or [address], [place] (336) 841-8500 Phone number for both [place] and [place] locations is the same.  °[facility] [address], [place] (336) 299-0000   °[facility] [address] [place] [address],  or [address] (336) 852-7530 °(336) 878-2260   °[facility] [address], [place] (336) 350-1642, phone; (336) 294-5005, fax Sees patients 1st and 3rd Saturday of every month.  Must not qualify for public or private insurance (i.e. Medicaid, Medicare, [place] Health Choice, Veterans' Benefits) • Household income should be no more than 200% of the poverty level •The clinic cannot treat you if you are pregnant or think you are pregnant • Sexually transmitted diseases are not treated at the clinic.  ° ° °Dental Care: °Organization         Address  Phone  Notes  °[facility] [address], [place] (336) 641-6152 Accepts children up to age 21 who are enrolled in Medicaid or [place] Health Choice; pregnant women with a Medicaid card; and children who have applied for Medicaid or [place] Health Choice, but were declined, whose parents can pay a reduced fee at time of service.  °[facility] [place]  [address], [place] (336) 641-7733 Accepts children up to age 21 who are enrolled in Medicaid or [place] Health Choice; pregnant women with a Medicaid card; and children who  have applied for Medicaid or [place] Health Choice, but were declined, whose parents can pay a reduced fee at time of service.  °[facility] ° [address], [place] (336) 641-4533 Patients are seen by appointment only. Walk-ins are not accepted. [facility] will see patients 18 years of age and older. °Monday - Tuesday (8am-5pm) °Most Wednesdays (8:30-5pm) °$30 per visit, cash only  °[facility] ° [address], [place] (336) 641-4533 Patients are seen by appointment only. Walk-ins are not accepted. [facility] will see patients 18 years of age and older. °One Wednesday Evening (Monthly: Volunteer Based).  $30 per visit, cash only  °[facility]  (919) 537-3737 for adults; Children under age 4, call Graduate Pediatric Dentistry at (919) [phone]. Children aged 4-14, please call (919) [phone] to request a pediatric application. ° Dental services are provided in all areas of dental care including fillings, crowns and bridges, complete and partial dentures, implants, gum treatment, root canals, and extractions. Preventive care is also provided. Treatment is provided to both adults and children. °Patients are selected via a lottery and there is often a waiting list. °  °[facility] [address], °[place] ° (336) 763-8833 [email or web address] °  °[facility] [address], [place], [place] (336)723-1848, Ext. 123 Second and Fourth Thursday of each month, opens at 6:30 AM; Clinic ends at 9 AM.  Patients are seen on a first-come first-served basis, and a limited number are seen during each clinic.  ° °[facility] ° [address], [place], [place] (336) 723-7904   Eligibility Requirements °You must have lived in [place], [place], or [place] counties for at least the last three months. °  You cannot be eligible for state or federal sponsored healthcare insurance, including Veterans  Administration, Medicaid, or Medicare. °  You generally cannot be eligible for healthcare insurance through your employer.  °  How to apply: °Eligibility screenings are held every Tuesday and Wednesday afternoon from 1:00 pm until 4:00 pm. You do not need an appointment for the interview!  °[facility] [address], [place], [place] 336-631-2330   °[facility]  336-342-8273   °[facility]  336-703-3100   °[facility]    336-570-6415   ° °Behavioral Health Resources in the Community: °Intensive Outpatient Programs °Organization         Address  Phone  Notes  °[facility] [address], [place], [place] 336-878-6098   °[facility] [address], [place], [place] 336-832-9800   °[facility] [address], [place], [place] ° 336-882-2125   °[facility] [address],  °[place], [place] 1-800-853-5163 or 336-641-4981   °Substance Abuse Resources °Organization         Address  Phone  Notes  °[facility]  336-882-2125   °[facility]  336-784-9470   °[facility]  336-285-9073   °[facility]  336-845-3988   °Residential & Outpatient Substance Abuse Program  1-800-659-3381   °Psychological Services °Organization         Address  Phone  Notes  °[facility]  336- 832-9600   °[facility]  336- 378-7881   °[facility] [address], [place] 1-800-853-5163 or 336-641-4981   ° °Mobile Crisis Teams °Organization         Address  Phone  Notes  °[facility], Mobile Crisis Care Unit  1-877-626-1772   °Assertive °[facility] ° [address] [place], [place] 336-834-9664   °Sharon DeEsch [address] °[place] 336-554-5454   ° °Self-Help/Support Groups °Organization         Address  Phone             Notes  °[facility] -  variety of support groups  336- 373-1402 Call for more information  °Narcotics Anonymous (NA), [facility] [address], °[place]  2 meetings at this location  ° °Residential Treatment Programs °Organization         Address  Phone  Notes  °[facility] [address],    °[place]  1-866-801-8205   °[facility] ° [address], [place], [place] 704-293-8524   °[facility] [address], [place] 336-845-3988 Admissions: 8am-3pm M-F  °[facility] [address]    °[place], [place] 336-841-1104   °[facility] [address], [place], [place] 336-379-7146   °[facility] [address]  °[place], [place] 336-285-9073   °[facility] - Intensive Outpatient [address], [place], [place] 336-852-3033   °[facility] [address]  °[place], [place] 1-877-615-2722 or 336-784-9470   °[facility] [address] [place], [place] 336-227-7417 Accepts Medicaid  °[facility] [address]  °[place] 1-800-659-3381 Substance Abuse/Addiction Treatment  ° °[place] Behavioral Health Resources °Organization         Address  Phone  Notes  °[facility]  (888) 581-9988   °Julie Brannon, PhD [address] [place], [place]   (336) 349-5553 or (336) 951-0000   °[facility]   [address] °[place], [place] (336) 349-4454   °[facility] [address], [place], [place] (336) 342-8316 Insurance/Medicaid/sponsorship through [facility]  °[facility] [address]                                    [place], [place] (336) 342-8316 Therapy/tele-psych/case  °[facility] [address]  ° [place], [place] (336) 349-2233    °Dr. Arfeen  (336) 349-4544   °[facility] [place]  [employer] [place] [facility] 1) [address], [place] °2) [address], [place] °3)  [address], [place] (336) 349-3220 °(336) 342-7768 ° °(336) 342-8140   °[place] [facility] (336) 342-1394 or (336) 342-3537 (After Hours)    ° ° ° °

## 2013-08-20 NOTE — ED Notes (Signed)
PT was admitted to [facility] on Monday for observation for chest pain and pt was seen at ED for same on Wednesday night. PT states left sided chest pain unrelieved and worsening.

## 2013-08-20 NOTE — ED Notes (Signed)
Request for medical records has been faxed to [facility].
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
## 2013-08-27 MED FILL — Hydrocodone-Acetaminophen Tab 5-325 MG: ORAL | Qty: 6 | Status: AC

## 2014-04-01 DIAGNOSIS — I493 Ventricular premature depolarization: Secondary | ICD-10-CM | POA: Insufficient documentation

## 2014-11-28 DIAGNOSIS — I10 Essential (primary) hypertension: Secondary | ICD-10-CM | POA: Insufficient documentation

## 2014-11-28 DIAGNOSIS — Z6835 Body mass index (BMI) 35.0-35.9, adult: Secondary | ICD-10-CM | POA: Insufficient documentation

## 2014-11-29 LAB — HM HIV SCREENING LAB: HM HIV Screening: NEGATIVE

## 2014-11-29 LAB — HM HEPATITIS C SCREENING LAB: HM Hepatitis Screen: NEGATIVE

## 2014-12-14 DIAGNOSIS — J449 Chronic obstructive pulmonary disease, unspecified: Secondary | ICD-10-CM | POA: Insufficient documentation

## 2014-12-26 DIAGNOSIS — M51369 Other intervertebral disc degeneration, lumbar region without mention of lumbar back pain or lower extremity pain: Secondary | ICD-10-CM

## 2014-12-26 DIAGNOSIS — M5136 Other intervertebral disc degeneration, lumbar region: Secondary | ICD-10-CM

## 2014-12-26 DIAGNOSIS — Z0000 Encounter for general adult medical examination without abnormal findings: Secondary | ICD-10-CM | POA: Insufficient documentation

## 2014-12-26 HISTORY — DX: Other intervertebral disc degeneration, lumbar region without mention of lumbar back pain or lower extremity pain: M51.369

## 2014-12-26 HISTORY — DX: Other intervertebral disc degeneration, lumbar region: M51.36

## 2015-09-02 ENCOUNTER — Emergency Department (HOSPITAL_COMMUNITY): Payer: BLUE CROSS/BLUE SHIELD

## 2015-09-02 ENCOUNTER — Inpatient Hospital Stay (HOSPITAL_COMMUNITY)
Admission: EM | Admit: 2015-09-02 | Discharge: 2015-09-05 | DRG: 292 | Disposition: A | Discharge status: Home / Self Care | Payer: BLUE CROSS/BLUE SHIELD | Attending: Internal Medicine | Admitting: Internal Medicine

## 2015-09-02 ENCOUNTER — Encounter (HOSPITAL_COMMUNITY): Payer: Self-pay

## 2015-09-02 DIAGNOSIS — Z6841 Body Mass Index (BMI) 40.0 and over, adult: Secondary | ICD-10-CM

## 2015-09-02 DIAGNOSIS — I509 Heart failure, unspecified: Secondary | ICD-10-CM

## 2015-09-02 DIAGNOSIS — E119 Type 2 diabetes mellitus without complications: Secondary | ICD-10-CM | POA: Diagnosis present

## 2015-09-02 DIAGNOSIS — I110 Hypertensive heart disease with heart failure: Secondary | ICD-10-CM | POA: Diagnosis present

## 2015-09-02 DIAGNOSIS — I10 Essential (primary) hypertension: Secondary | ICD-10-CM | POA: Diagnosis not present

## 2015-09-02 DIAGNOSIS — Z8249 Family history of ischemic heart disease and other diseases of the circulatory system: Secondary | ICD-10-CM

## 2015-09-02 DIAGNOSIS — I5041 Acute combined systolic (congestive) and diastolic (congestive) heart failure: Secondary | ICD-10-CM | POA: Diagnosis present

## 2015-09-02 DIAGNOSIS — I5020 Unspecified systolic (congestive) heart failure: Secondary | ICD-10-CM | POA: Insufficient documentation

## 2015-09-02 DIAGNOSIS — R0602 Shortness of breath: Secondary | ICD-10-CM | POA: Diagnosis present

## 2015-09-02 DIAGNOSIS — F17210 Nicotine dependence, cigarettes, uncomplicated: Secondary | ICD-10-CM | POA: Diagnosis present

## 2015-09-02 DIAGNOSIS — F1410 Cocaine abuse, uncomplicated: Secondary | ICD-10-CM | POA: Diagnosis present

## 2015-09-02 DIAGNOSIS — I5031 Acute diastolic (congestive) heart failure: Secondary | ICD-10-CM

## 2015-09-02 DIAGNOSIS — K219 Gastro-esophageal reflux disease without esophagitis: Secondary | ICD-10-CM | POA: Diagnosis present

## 2015-09-02 DIAGNOSIS — R0609 Other forms of dyspnea: Secondary | ICD-10-CM | POA: Diagnosis present

## 2015-09-02 LAB — CBC WITH DIFFERENTIAL/PLATELET
BASOS PCT: 0 %
Basophils Absolute: 0 10*3/uL (ref 0.0–0.1)
Eosinophils Absolute: 0.2 10*3/uL (ref 0.0–0.7)
Eosinophils Relative: 2 %
HEMATOCRIT: 39.4 % (ref 39.0–52.0)
Hemoglobin: 12.6 g/dL — ABNORMAL LOW (ref 13.0–17.0)
Lymphocytes Relative: 26 %
Lymphs Abs: 2.7 10*3/uL (ref 0.7–4.0)
MCH: 28 pg (ref 26.0–34.0)
MCHC: 32 g/dL (ref 30.0–36.0)
MCV: 87.6 fL (ref 78.0–100.0)
MONO ABS: 0.7 10*3/uL (ref 0.1–1.0)
Monocytes Relative: 7 %
NEUTROS ABS: 6.7 10*3/uL (ref 1.7–7.7)
NEUTROS PCT: 65 %
Platelets: 192 10*3/uL (ref 150–400)
RBC: 4.5 MIL/uL (ref 4.22–5.81)
RDW: 14.1 % (ref 11.5–15.5)
WBC: 10.4 10*3/uL (ref 4.0–10.5)

## 2015-09-02 LAB — BASIC METABOLIC PANEL
ANION GAP: 4 — AB (ref 5–15)
BUN: 23 mg/dL — AB (ref 6–20)
CO2: 26 mmol/L (ref 22–32)
Calcium: 8.4 mg/dL — ABNORMAL LOW (ref 8.9–10.3)
Chloride: 107 mmol/L (ref 101–111)
Creatinine, Ser: 1.14 mg/dL (ref 0.61–1.24)
Glucose, Bld: 115 mg/dL — ABNORMAL HIGH (ref 65–99)
Potassium: 3.7 mmol/L (ref 3.5–5.1)
Sodium: 137 mmol/L (ref 135–145)

## 2015-09-02 LAB — TROPONIN I
TROPONIN I: 0.13 ng/mL — AB (ref <0.03)
Troponin I: 0.08 ng/mL (ref <0.03)

## 2015-09-02 LAB — BRAIN NATRIURETIC PEPTIDE: B NATRIURETIC PEPTIDE 5: 24 pg/mL (ref 0.0–100.0)

## 2015-09-02 MED ORDER — ASPIRIN 325 MG PO TABS
325.0000 mg | ORAL_TABLET | Freq: Once | ORAL | Status: AC
  Administered 2015-09-02: 325 mg via ORAL
  Filled 2015-09-02: qty 1

## 2015-09-02 MED ORDER — ALBUTEROL SULFATE (2.5 MG/3ML) 0.083% IN NEBU: 10.0000 mg | INHALATION_SOLUTION | Freq: Once | RESPIRATORY_TRACT | Status: DC

## 2015-09-02 MED ORDER — ASPIRIN EC 81 MG PO TBEC
81.0000 mg | DELAYED_RELEASE_TABLET | Freq: Every day | ORAL | Status: DC
  Administered 2015-09-02 – 2015-09-05 (×4): 81 mg via ORAL
  Filled 2015-09-02 (×4): qty 1

## 2015-09-02 MED ORDER — SODIUM CHLORIDE 0.9% FLUSH
3.0000 mL | INTRAVENOUS | Status: DC | PRN
  Administered 2015-09-05: 3 mL via INTRAVENOUS
  Filled 2015-09-02: qty 3

## 2015-09-02 MED ORDER — CARVEDILOL 3.125 MG PO TABS
3.1250 mg | ORAL_TABLET | Freq: Two times a day (BID) | ORAL | Status: DC
  Administered 2015-09-02 – 2015-09-04 (×4): 3.125 mg via ORAL
  Filled 2015-09-02 (×4): qty 1

## 2015-09-02 MED ORDER — SODIUM CHLORIDE 0.9% FLUSH
3.0000 mL | Freq: Two times a day (BID) | INTRAVENOUS | Status: DC
  Administered 2015-09-02 – 2015-09-04 (×4): 3 mL via INTRAVENOUS

## 2015-09-02 MED ORDER — ALBUTEROL SULFATE (2.5 MG/3ML) 0.083% IN NEBU
INHALATION_SOLUTION | RESPIRATORY_TRACT | Status: AC
  Administered 2015-09-02: 5 mg via RESPIRATORY_TRACT
  Filled 2015-09-02: qty 3

## 2015-09-02 MED ORDER — FUROSEMIDE 10 MG/ML IJ SOLN
80.0000 mg | Freq: Once | INTRAMUSCULAR | Status: AC
  Administered 2015-09-02: 80 mg via INTRAVENOUS
  Filled 2015-09-02: qty 8

## 2015-09-02 MED ORDER — IOPAMIDOL (ISOVUE-370) INJECTION 76%
100.0000 mL | Freq: Once | INTRAVENOUS | Status: AC | PRN
  Administered 2015-09-02: 100 mL via INTRAVENOUS

## 2015-09-02 MED ORDER — BENZONATATE 100 MG PO CAPS
100.0000 mg | ORAL_CAPSULE | Freq: Two times a day (BID) | ORAL | Status: DC | PRN
  Administered 2015-09-02 – 2015-09-04 (×5): 100 mg via ORAL
  Filled 2015-09-02 (×6): qty 1

## 2015-09-02 MED ORDER — HYDROCHLOROTHIAZIDE 25 MG PO TABS
25.0000 mg | ORAL_TABLET | Freq: Every day | ORAL | Status: DC
  Administered 2015-09-02: 25 mg via ORAL
  Filled 2015-09-02: qty 1

## 2015-09-02 MED ORDER — GI COCKTAIL ~~LOC~~
30.0000 mL | Freq: Once | ORAL | Status: AC
  Administered 2015-09-02: 30 mL via ORAL
  Filled 2015-09-02: qty 30

## 2015-09-02 MED ORDER — SODIUM CHLORIDE 0.9 % IV SOLN: 250.0000 mL | INTRAVENOUS | Status: DC | PRN

## 2015-09-02 MED ORDER — ONDANSETRON HCL 4 MG/2ML IJ SOLN: 4.0000 mg | Freq: Four times a day (QID) | INTRAMUSCULAR | Status: DC | PRN

## 2015-09-02 MED ORDER — ALPRAZOLAM 0.5 MG PO TABS
0.5000 mg | ORAL_TABLET | Freq: Every day | ORAL | Status: DC
  Administered 2015-09-02 – 2015-09-04 (×3): 0.5 mg via ORAL
  Filled 2015-09-02 (×3): qty 1

## 2015-09-02 MED ORDER — FUROSEMIDE 10 MG/ML IJ SOLN
40.0000 mg | Freq: Two times a day (BID) | INTRAMUSCULAR | Status: DC
  Administered 2015-09-02 – 2015-09-05 (×6): 40 mg via INTRAVENOUS
  Filled 2015-09-02 (×6): qty 4

## 2015-09-02 MED ORDER — ALBUTEROL SULFATE (2.5 MG/3ML) 0.083% IN NEBU
INHALATION_SOLUTION | RESPIRATORY_TRACT | Status: AC
  Filled 2015-09-02: qty 3

## 2015-09-02 MED ORDER — ALBUTEROL SULFATE (2.5 MG/3ML) 0.083% IN NEBU
2.5000 mg | INHALATION_SOLUTION | Freq: Four times a day (QID) | RESPIRATORY_TRACT | Status: DC | PRN
  Administered 2015-09-03: 2.5 mg via RESPIRATORY_TRACT
  Filled 2015-09-02: qty 3

## 2015-09-02 MED ORDER — METHYLPREDNISOLONE SODIUM SUCC 125 MG IJ SOLR
125.0000 mg | Freq: Once | INTRAMUSCULAR | Status: AC
  Administered 2015-09-02: 125 mg via INTRAVENOUS
  Filled 2015-09-02: qty 2

## 2015-09-02 MED ORDER — LISINOPRIL 5 MG PO TABS
5.0000 mg | ORAL_TABLET | Freq: Every day | ORAL | Status: DC
  Administered 2015-09-02 – 2015-09-05 (×4): 5 mg via ORAL
  Filled 2015-09-02 (×4): qty 1

## 2015-09-02 MED ORDER — ALBUTEROL SULFATE (2.5 MG/3ML) 0.083% IN NEBU
5.0000 mg | INHALATION_SOLUTION | Freq: Once | RESPIRATORY_TRACT | Status: AC
  Administered 2015-09-02: 5 mg via RESPIRATORY_TRACT
  Filled 2015-09-02: qty 6

## 2015-09-02 MED ORDER — ACETAMINOPHEN 325 MG PO TABS
650.0000 mg | ORAL_TABLET | ORAL | Status: DC | PRN
  Administered 2015-09-03 (×2): 650 mg via ORAL
  Filled 2015-09-02 (×2): qty 2

## 2015-09-02 MED ORDER — ALBUTEROL (5 MG/ML) CONTINUOUS INHALATION SOLN
INHALATION_SOLUTION | RESPIRATORY_TRACT | Status: AC
  Administered 2015-09-02: 06:00:00
  Filled 2015-09-02: qty 20

## 2015-09-02 MED ORDER — ENOXAPARIN SODIUM 40 MG/0.4ML ~~LOC~~ SOLN
40.0000 mg | SUBCUTANEOUS | Status: DC
  Administered 2015-09-02 – 2015-09-04 (×3): 40 mg via SUBCUTANEOUS
  Filled 2015-09-02 (×3): qty 0.4

## 2015-09-02 MED ORDER — ALUM & MAG HYDROXIDE-SIMETH 200-200-20 MG/5ML PO SUSP
30.0000 mL | Freq: Once | ORAL | Status: AC
  Administered 2015-09-02: 30 mL via ORAL
  Filled 2015-09-02: qty 30

## 2015-09-02 MED ORDER — ALBUTEROL SULFATE (2.5 MG/3ML) 0.083% IN NEBU
2.5000 mg | INHALATION_SOLUTION | Freq: Once | RESPIRATORY_TRACT | Status: AC
  Administered 2015-09-02: 5 mg via RESPIRATORY_TRACT
  Filled 2015-09-02: qty 3

## 2015-09-02 MED ORDER — IPRATROPIUM-ALBUTEROL 0.5-2.5 (3) MG/3ML IN SOLN
3.0000 mL | Freq: Once | RESPIRATORY_TRACT | Status: AC
  Administered 2015-09-02: 3 mL via RESPIRATORY_TRACT
  Filled 2015-09-02: qty 3

## 2015-09-02 MED ORDER — IPRATROPIUM-ALBUTEROL 0.5-2.5 (3) MG/3ML IN SOLN
RESPIRATORY_TRACT | Status: AC
  Filled 2015-09-02: qty 3

## 2015-09-02 NOTE — ED Notes (Signed)
Report given to Audrea Muscat, RN. Pt ready for transport.

## 2015-09-02 NOTE — ED Notes (Signed)
Pt back in room,repositioned, and placed back on cardiac monitor.Pt reports cough and burning sensation in chest. Pt reports discomfort with breathing has been intermittent for last several weeks. EDP aware. No new orders given.
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
## 2015-09-02 NOTE — ED Notes (Signed)
CRITICAL VALUE ALERT  Critical value received:  Troponin 0.13  Date of notification:  09/02/15  Time of notification:  802  Critical value read back:Yes.    Nurse who received alert:  Parthenia Ames  MD notified (1st page):  Dr. Dayna Barker  Time of first page:  803  MD notified (2nd page):  Time of second page:  Responding MD:  Dr. Dayna Barker  Time MD responded:  [phone]
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
## 2015-09-02 NOTE — ED Triage Notes (Signed)
Pt states he was at work when he started feeling sob, wheezing, and tightness in his chest.  Pt states he has a nebulizer at home but has run out of his hand held inhaler.  Pt with dry cough

## 2015-09-03 ENCOUNTER — Inpatient Hospital Stay (HOSPITAL_COMMUNITY): Payer: BLUE CROSS/BLUE SHIELD

## 2015-09-03 DIAGNOSIS — I10 Essential (primary) hypertension: Secondary | ICD-10-CM

## 2015-09-03 LAB — RAPID URINE DRUG SCREEN, HOSP PERFORMED
AMPHETAMINES: NOT DETECTED
BARBITURATES: POSITIVE — AB
BENZODIAZEPINES: POSITIVE — AB
Cocaine: POSITIVE — AB
Opiates: NOT DETECTED
Tetrahydrocannabinol: NOT DETECTED

## 2015-09-03 LAB — TROPONIN I: TROPONIN I: 0.08 ng/mL — AB (ref <0.03)

## 2015-09-03 LAB — BASIC METABOLIC PANEL
ANION GAP: 9 (ref 5–15)
BUN: 20 mg/dL (ref 6–20)
CALCIUM: 9.4 mg/dL (ref 8.9–10.3)
CO2: 28 mmol/L (ref 22–32)
Chloride: 100 mmol/L — ABNORMAL LOW (ref 101–111)
Creatinine, Ser: 0.95 mg/dL (ref 0.61–1.24)
GFR calc Af Amer: >60 mL/min (ref >60)
GLUCOSE: 139 mg/dL — AB (ref 65–99)
Potassium: 4 mmol/L (ref 3.5–5.1)
SODIUM: 137 mmol/L (ref 135–145)

## 2015-09-03 LAB — ECHOCARDIOGRAM COMPLETE
Height: 71 in
Weight: 4752 oz

## 2015-09-03 MED ORDER — ALUM & MAG HYDROXIDE-SIMETH 200-200-20 MG/5ML PO SUSP
30.0000 mL | Freq: Once | ORAL | Status: AC
  Administered 2015-09-03: 30 mL via ORAL
  Filled 2015-09-03: qty 30

## 2015-09-03 MED ORDER — NICOTINE 14 MG/24HR TD PT24
14.0000 mg | MEDICATED_PATCH | Freq: Every day | TRANSDERMAL | Status: DC
  Administered 2015-09-03 – 2015-09-05 (×3): 14 mg via TRANSDERMAL
  Filled 2015-09-03 (×3): qty 1

## 2015-09-03 NOTE — Progress Notes (Signed)
*  PRELIMINARY RESULTS* Echocardiogram 2D Echocardiogram has been performed.  Edward Burton 09/03/2015, 9:58 AM

## 2015-09-03 NOTE — Progress Notes (Signed)
PROGRESS NOTE    Edward Burton  [id number] DOB: 1960-05-21 DOA: 09/02/2015 PCP: No PCP Per Patient     Brief Narrative:  55 y/o man admitted 8/26 for SOB. Thought to have acute CHF, type unknown. ECHO pending.   Assessment & Plan:   Principal Problem:   SOB (shortness of breath) Active Problems:   Acute CHF (HCC)   HTN (hypertension)   Acute CHF (congestive heart failure) (HCC)   Acute CHF -Type unknown, ECHO pending. -Continue lasix at current dose (is 820 ml negative since admission). -Continue ACE-I, BB. -Check lipids.  SOB -Improved, likely related to acute CHF.  HTN -Well controlled on current medications.  Elevated troponin -Flat, No CP. -EKG with inf/lat TWC that are old. -Await ECHO. As long as EF normal and no WMA, do not suspect any further cardiac work up.  Morbid Obesity -Counseled on weight loss, diet modification and exercise.   DVT prophylaxis: Lovenox Code Status: full code Family Communication: patient only Disposition Plan: to be determined  Consultants:   none  Procedures:   none  Antimicrobials:   none    Subjective: Feels less SOB, still very weak. Would like a PT evaluation.  Objective: Vitals:   09/02/15 1944 09/02/15 2025 09/03/15 0500 09/03/15 1015  BP:  126/61 130/64   Pulse:  96 70   Resp:  20 (!) 21   Temp:  98.1 F (36.7 C) 97.9 F (36.6 C)   TempSrc:  Oral Oral   SpO2: 95% 97% 98% 98%  Weight:   134.7 kg (297 lb)   Height:        Intake/Output Summary (Last 24 hours) at 09/03/15 1025 Last data filed at 09/03/15 0500  Gross per 24 hour  Intake              480 ml  Output             1300 ml  Net             -820 ml   Filed Weights   09/02/15 1153 09/02/15 1702 09/03/15 0500  Weight: (!) 138.1 kg (304 lb 8 oz) (!) 138.1 kg (304 lb 8 oz) 134.7 kg (297 lb)    Examination:  General exam: Alert, awake, oriented x 3, morbidly obese Respiratory system: Clear to auscultation. Respiratory effort  normal. Cardiovascular system:RRR. No murmurs, rubs, gallops. Gastrointestinal system: Abdomen is nondistended, soft and nontender. No organomegaly or masses felt. Normal bowel sounds heard. Central nervous system: Alert and oriented. No focal neurological deficits. Extremities: 1+ edema bilaterally, +pedal pulses Psychiatry: Judgement and insight appear normal. Mood & affect appropriate.     Data Reviewed: I have personally reviewed following labs and imaging studies  CBC:  Recent Labs Lab 09/02/15 0622  WBC 10.4  NEUTROABS 6.7  HGB 12.6*  HCT 39.4  MCV 87.6  PLT [id number]   Basic Metabolic Panel:  Recent Labs Lab 09/02/15 0622 09/02/15 2324  NA 137 137  K 3.7 4.0  CL 107 100*  CO2 26 28  GLUCOSE 115* 139*  BUN 23* 20  CREATININE 1.14 0.95  CALCIUM 8.4* 9.4   GFR: Estimated Creatinine Clearance: 124.6 mL/min (by C-G formula based on SCr of 0.95 mg/dL). Liver Function Tests: No results for input(s): AST, ALT, ALKPHOS, BILITOT, PROT, ALBUMIN in the last 168 hours. No results for input(s): LIPASE, AMYLASE in the last 168 hours. No results for input(s): AMMONIA in the last 168 hours. Coagulation Profile: No results for input(s): INR, PROTIME  in the last 168 hours. Cardiac Enzymes:  Recent Labs Lab 09/02/15 0622 09/02/15 1728 09/02/15 2324  TROPONINI 0.13* 0.08* 0.08*   BNP (last 3 results) No results for input(s): PROBNP in the last 8760 hours. HbA1C: No results for input(s): HGBA1C in the last 72 hours. CBG: No results for input(s): GLUCAP in the last 168 hours. Lipid Profile: No results for input(s): CHOL, HDL, LDLCALC, TRIG, CHOLHDL, LDLDIRECT in the last 72 hours. Thyroid Function Tests: No results for input(s): TSH, T4TOTAL, FREET4, T3FREE, THYROIDAB in the last 72 hours. Anemia Panel: No results for input(s): VITAMINB12, FOLATE, FERRITIN, TIBC, IRON, RETICCTPCT in the last 72 hours. Urine analysis:    Component Value Date/Time   COLORURINE YELLOW  03/24/2013 1813   APPEARANCEUR CLEAR 03/24/2013 1813   LABSPEC 1.025 03/24/2013 1813   PHURINE 6.5 03/24/2013 1813   GLUCOSEU NEGATIVE 03/24/2013 1813   HGBUR NEGATIVE 03/24/2013 1813   BILIRUBINUR NEGATIVE 03/24/2013 1813   KETONESUR NEGATIVE 03/24/2013 1813   PROTEINUR NEGATIVE 03/24/2013 1813   UROBILINOGEN 0.2 03/24/2013 1813   NITRITE NEGATIVE 03/24/2013 1813   LEUKOCYTESUR NEGATIVE 03/24/2013 1813   Sepsis Labs: @LABRCNTIP (procalcitonin:4,lacticidven:4)  )No results found for this or any previous visit (from the past 240 hour(s)).       Radiology Studies: Ct Angio Chest Pe W And/or Wo Contrast  Result Date: 09/02/2015 CLINICAL DATA:  Chest tightness.  Dry cough, COPD.  Smoker. EXAM: CT ANGIOGRAPHY CHEST WITH CONTRAST TECHNIQUE: Multidetector CT imaging of the chest was performed using the standard protocol during bolus administration of intravenous contrast. Multiplanar CT image reconstructions and MIPs were obtained to evaluate the vascular anatomy. CONTRAST:  100 mL Isovue 370 COMPARISON:  08/20/2013 FINDINGS: Cardiovascular: No filling defects within pulmonary suggest acute pulmonary embolism. No acute findings of the aorta or great vessels. No pericardial fluid. Mediastinum/Nodes: No axillary supraclavicular adenopathy. No mediastinal adenopathy. Esophagus normal. Lungs/Pleura: Mild atelectasis at the LEFT lung base. No airspace disease. No pneumothorax. No suspicious nodularity. Upper Abdomen: Adrenal glands are normal. Low-density lesion in the LEFT kidney likely represents a cyst. Musculoskeletal: No aggressive osseous lesion. Review of the MIP images confirms the above findings. IMPRESSION: 1. No evidence acute pulmonary embolism. 2. No acute pulmonary parenchymal findings. Electronically Signed   By: Suzy Bouchard M.D.   On: 09/02/2015 09:59   Dg Chest Portable 1 View  Result Date: 09/02/2015 CLINICAL DATA:  Shortness of breath and wheezing, chest pain last night.  Intermittent cough for 2 weeks. History of hypertension, diabetes in LEFT pneumothorax. EXAM: PORTABLE CHEST 1 VIEW COMPARISON:  Chest radiograph October 21, 2014 FINDINGS: Cardiac silhouette is upper limits of normal in size, mediastinal silhouette is nonsuspicious. Pulmonary vascular congestion without pleural effusion or focal consolidation. No pneumothorax. Multiple old LEFT rib fractures. IMPRESSION: Borderline cardiomegaly, pulmonary vascular congestion. Electronically Signed   By: Elon Alas M.D.   On: 09/02/2015 06:46        Scheduled Meds: . ALPRAZolam  0.5 mg Oral QHS  . aspirin EC  81 mg Oral Daily  . carvedilol  3.125 mg Oral BID WC  . enoxaparin (LOVENOX) injection  40 mg Subcutaneous Q24H  . furosemide  40 mg Intravenous BID  . lisinopril  5 mg Oral Daily  . sodium chloride flush  3 mL Intravenous Q12H   Continuous Infusions:    LOS: 1 day    Time spent: 25 minutes. Greater than 50% of this time was spent in direct contact with the patient coordinating care.  Lelon Frohlich, MD [facility] Pager [phone]  If 7PM-7AM, please contact night-coverage [email or web address] Password [facility] 09/03/2015, 10:25 AM

## 2015-09-04 DIAGNOSIS — I5041 Acute combined systolic (congestive) and diastolic (congestive) heart failure: Secondary | ICD-10-CM

## 2015-09-04 LAB — BASIC METABOLIC PANEL
ANION GAP: 9 (ref 5–15)
BUN: 24 mg/dL — ABNORMAL HIGH (ref 6–20)
CALCIUM: 8.5 mg/dL — AB (ref 8.9–10.3)
CO2: 30 mmol/L (ref 22–32)
Chloride: 99 mmol/L — ABNORMAL LOW (ref 101–111)
Creatinine, Ser: 0.92 mg/dL (ref 0.61–1.24)
GLUCOSE: 157 mg/dL — AB (ref 65–99)
POTASSIUM: 3.4 mmol/L — AB (ref 3.5–5.1)
Sodium: 138 mmol/L (ref 135–145)

## 2015-09-04 MED ORDER — FOLIC ACID 1 MG PO TABS
1.0000 mg | ORAL_TABLET | Freq: Every day | ORAL | Status: DC
  Administered 2015-09-05: 1 mg via ORAL
  Filled 2015-09-04: qty 1

## 2015-09-04 MED ORDER — VITAMIN B-1 100 MG PO TABS
100.0000 mg | ORAL_TABLET | Freq: Every day | ORAL | Status: DC
  Administered 2015-09-05: 100 mg via ORAL
  Filled 2015-09-04: qty 1

## 2015-09-04 MED ORDER — OXYCODONE HCL 5 MG PO TABS
5.0000 mg | ORAL_TABLET | Freq: Four times a day (QID) | ORAL | Status: DC | PRN
  Administered 2015-09-04 – 2015-09-05 (×3): 5 mg via ORAL
  Filled 2015-09-04 (×3): qty 1

## 2015-09-04 NOTE — Care Management Note (Signed)
Case Management Note  Patient Details  Name: Edward Burton MRN: [id number] Date of Birth: January 06, 1961  Subjective/Objective:  Patient is from home, ind with ADLs'. Currently seeking treatment for drug abuse. CSW aware.                   Action/Plan: CSW working with patient to attempt placement into a treatment facility. Will sign off.    Expected Discharge Date:  09/03/15               [facility]  In-House Referral:  Clinical Social Work  Discharge planning Services     Post Acute Care Choice:  NA Choice offered to:  NA  DME Arranged:    DME Agency:     HH Arranged:    HH Agency:     Status of Service:  In process, will continue to follow  If discussed at Long Length of Stay Meetings, dates discussed:    Additional Comments:  Emy Angevine, Chauncey Reading, RN 09/04/2015, 1:29 PM

## 2015-09-04 NOTE — Clinical Social Work Note (Signed)
Patient Information   Patient Name Edward Burton, Edward Burton ([id number]) Sex Male DOB 1960/08/12  SSN [id number]  Room Bed  A317 A317-01  Patient Demographics   Address [id number] [place] [id number] Phone [phone] (Home)  Patient Ethnicity & Race   Ethnic Group Patient Race  Not Hispanic or Latino Black or African American  Emergency Contact(s)   Name Relation Home Work [place] Son [phone]    R[place], [place] Mother [phone]    Documents on File    Status Date Received Description  Documents for the Patient  [place] Not Received    [place] E-Signature HIPAA Notice of Privacy Received [id number]   Driver's License Not Received    Insurance Card Not Received    Advance Directives/Living Will/HCPOA/POA Not Received    AMB Outside Consult Note  04/23/12 04/14 Card [place]  Insurance Card Received 03/15/13 [employer](BAS benefit adm)  Driver's License  [id number]   Release of Information  08/23/13   Other Photo ID Not Received    Insurance Card Received 09/02/15 BCBS  AMB Outside Consult Note (Deleted) 04/23/12   AMB Outside Consult Note (Deleted) 04/23/12 04/14 [place]  Documents for the Encounter  AOB (Assignment of Insurance Benefits) Not Received    E-signature AOB Signed 09/02/15   MEDICARE RIGHTS Not Received    E-signature Medicare Rights     Cardiac Monitoring Strip  09/02/15   ED Patient Billing Extract   ED PB Summary  ED Patient Billing Extract   ED Encounter Summary  EKG  09/04/15   Admission Information   Attending Provider Admitting Provider Admission Type Admission Date/Time  Estela Leonie Green, MD Erline Hau, MD Emergency 09/02/15 [phone]  Discharge Date Hospital Service Auth/Cert Status Service Area   Internal Medicine Incomplete [place]  Unit Room/Bed Admission Status   AP-DEPT 300 A317/A317-01 Admission (Confirmed)   Admission   Complaint   Shortness of [place] [facility] Account   Name Acct ID Class Status Primary Coverage  Amandus, [place] [id number] Inpatient Open BLUE [place] Account (for Hospital Account [id number])   Name Relation to [place]? Acct Type  [name] Gene Self CHSA Yes Personal/Family  Address Phone    [id number] [place], [place] [id number] [phone]        Coverage Information (for Hospital Account [id number])   F/O Payor/Plan Precert #  [facility] OTHER   Subscriber Subscriber #       Lynch, Alanson [id number]  Address Phone  [address] [place], [place] [address] [phone]

## 2015-09-04 NOTE — Progress Notes (Signed)
PROGRESS NOTE    Edward Burton  [id number] DOB: 1960-05-23 DOA: 09/02/2015 PCP: No PCP Per Patient     Brief Narrative:  55 y/o man admitted 8/26 for SOB. ECHO with systolic CHF, UDS positive for cocaine.   Assessment & Plan:   Principal Problem:   SOB (shortness of breath) Active Problems:   Acute CHF (HCC)   HTN (hypertension)   Acute combined systolic and diastolic CHF, NYHA class 1 (HCC)   Acute Combined CHF -ECHO with EF 35-40% and moderate LVH. -Continue lasix at current dose (is 1540 ml negative since admission). -Continue ACE-I, stop newly started BB due to active cocaine use. -Check lipids.  SOB -Improved, likely related to acute CHF.  HTN -Well controlled on current medications.  Elevated troponin -Flat, No CP. -EKG with inf/lat TWC that are old. -Given abnormalities on ECHO (may be cocaine-induced) will request cardiology consultation as he has never had an ischemic work up before.  Morbid Obesity -Counseled on weight loss, diet modification and exercise.  Polysubstance Abuse -Including ETOH and cocaine. -Counseling provided. -He has requested inpatient rehab and SW is aware. -Thiamine/folate   DVT prophylaxis: Lovenox Code Status: full code Family Communication: multiple family members at bedside updated on plan of care and all questions answered. Disposition Plan: to be determined  Consultants:   none  Procedures:   none  Antimicrobials:   none    Subjective: Feels less SOB, still very weak. Interested in inpatient rehab.  Objective: Vitals:   09/03/15 2043 09/03/15 2047 09/04/15 0630 09/04/15 1435  BP:  (!) 144/74 128/74 (!) 118/51  Pulse:  86 80 84  Resp:  20 20 20   Temp:  98.1 F (36.7 C) 97.9 F (36.6 C) 98.4 F (36.9 C)  TempSrc:  Oral Oral Oral  SpO2: 96% 98% 97% 93%  Weight:   (!) 136.6 kg (301 lb 1.6 oz)   Height:        Intake/Output Summary (Last 24 hours) at 09/04/15 1550 Last data filed at 09/04/15  1437  Gross per 24 hour  Intake                0 ml  Output              500 ml  Net             -500 ml   Filed Weights   09/02/15 1702 09/03/15 0500 09/04/15 0630  Weight: (!) 138.1 kg (304 lb 8 oz) 134.7 kg (297 lb) (!) 136.6 kg (301 lb 1.6 oz)    Examination:  General exam: Alert, awake, oriented x 3, morbidly obese Respiratory system: Clear to auscultation. Respiratory effort normal. Cardiovascular system:RRR. No murmurs, rubs, gallops. Gastrointestinal system: Abdomen is nondistended, soft and nontender. No organomegaly or masses felt. Normal bowel sounds heard. Central nervous system: Alert and oriented. No focal neurological deficits. Extremities: 1+ edema bilaterally, +pedal pulses Psychiatry: Judgement and insight appear normal. Mood & affect appropriate.     Data Reviewed: I have personally reviewed following labs and imaging studies  CBC:  Recent Labs Lab 09/02/15 0622  WBC 10.4  NEUTROABS 6.7  HGB 12.6*  HCT 39.4  MCV 87.6  PLT [id number]   Basic Metabolic Panel:  Recent Labs Lab 09/02/15 0622 09/02/15 2324 09/04/15 0526  NA 137 137 138  K 3.7 4.0 3.4*  CL 107 100* 99*  CO2 26 28 30   GLUCOSE 115* 139* 157*  BUN 23* 20 24*  CREATININE 1.14 0.95 0.92  CALCIUM 8.4* 9.4 8.5*   GFR: Estimated Creatinine Clearance: 129.6 mL/min (by C-G formula based on SCr of 0.92 mg/dL). Liver Function Tests: No results for input(s): AST, ALT, ALKPHOS, BILITOT, PROT, ALBUMIN in the last 168 hours. No results for input(s): LIPASE, AMYLASE in the last 168 hours. No results for input(s): AMMONIA in the last 168 hours. Coagulation Profile: No results for input(s): INR, PROTIME in the last 168 hours. Cardiac Enzymes:  Recent Labs Lab 09/02/15 0622 09/02/15 1728 09/02/15 2324  TROPONINI 0.13* 0.08* 0.08*   BNP (last 3 results) No results for input(s): PROBNP in the last 8760 hours. HbA1C: No results for input(s): HGBA1C in the last 72 hours. CBG: No results for  input(s): GLUCAP in the last 168 hours. Lipid Profile: No results for input(s): CHOL, HDL, LDLCALC, TRIG, CHOLHDL, LDLDIRECT in the last 72 hours. Thyroid Function Tests: No results for input(s): TSH, T4TOTAL, FREET4, T3FREE, THYROIDAB in the last 72 hours. Anemia Panel: No results for input(s): VITAMINB12, FOLATE, FERRITIN, TIBC, IRON, RETICCTPCT in the last 72 hours. Urine analysis:    Component Value Date/Time   COLORURINE YELLOW 03/24/2013 1813   APPEARANCEUR CLEAR 03/24/2013 1813   LABSPEC 1.025 03/24/2013 1813   PHURINE 6.5 03/24/2013 1813   GLUCOSEU NEGATIVE 03/24/2013 1813   HGBUR NEGATIVE 03/24/2013 1813   BILIRUBINUR NEGATIVE 03/24/2013 1813   KETONESUR NEGATIVE 03/24/2013 1813   PROTEINUR NEGATIVE 03/24/2013 1813   UROBILINOGEN 0.2 03/24/2013 1813   NITRITE NEGATIVE 03/24/2013 1813   LEUKOCYTESUR NEGATIVE 03/24/2013 1813   Sepsis Labs: @LABRCNTIP (procalcitonin:4,lacticidven:4)  )No results found for this or any previous visit (from the past 240 hour(s)).       Radiology Studies: No results found.      Scheduled Meds: . ALPRAZolam  0.5 mg Oral QHS  . aspirin EC  81 mg Oral Daily  . carvedilol  3.125 mg Oral BID WC  . enoxaparin (LOVENOX) injection  40 mg Subcutaneous Q24H  . furosemide  40 mg Intravenous BID  . lisinopril  5 mg Oral Daily  . nicotine  14 mg Transdermal Daily  . sodium chloride flush  3 mL Intravenous Q12H   Continuous Infusions:    LOS: 2 days    Time spent: 25 minutes. Greater than 50% of this time was spent in direct contact with the patient coordinating care.     Lelon Frohlich, MD [facility] Pager [phone]  If 7PM-7AM, please contact night-coverage [email or web address] Password [email or web address] 09/04/2015, 3:50 PM

## 2015-09-05 LAB — BASIC METABOLIC PANEL
Anion gap: 7 (ref 5–15)
BUN: 22 mg/dL — ABNORMAL HIGH (ref 6–20)
CALCIUM: 8.4 mg/dL — AB (ref 8.9–10.3)
CO2: 30 mmol/L (ref 22–32)
CREATININE: 0.99 mg/dL (ref 0.61–1.24)
Chloride: 102 mmol/L (ref 101–111)
GFR calc non Af Amer: >60 mL/min (ref >60)
Glucose, Bld: 119 mg/dL — ABNORMAL HIGH (ref 65–99)
Potassium: 3.5 mmol/L (ref 3.5–5.1)
SODIUM: 139 mmol/L (ref 135–145)

## 2015-09-05 MED ORDER — LISINOPRIL 5 MG PO TABS: 5.0000 mg | ORAL_TABLET | Freq: Every day | ORAL | 2 refills | Status: DC

## 2015-09-05 MED ORDER — FUROSEMIDE 40 MG PO TABS: 40.0000 mg | ORAL_TABLET | Freq: Every day | ORAL | 2 refills | Status: DC

## 2015-09-05 MED ORDER — ASPIRIN 81 MG PO TBEC: 81.0000 mg | DELAYED_RELEASE_TABLET | Freq: Every day | ORAL | Status: DC

## 2015-09-05 MED ORDER — NICOTINE 14 MG/24HR TD PT24
14.0000 mg | MEDICATED_PATCH | Freq: Every day | TRANSDERMAL | 0 refills | Status: DC
Start: 2015-09-05 — End: 2017-06-17

## 2015-09-05 NOTE — Discharge Summary (Signed)
Physician Discharge Summary  Edward Burton [id number] DOB: Sep 12, 1960 DOA: 09/02/2015  PCP: No PCP Per Patient  Admit date: 09/02/2015 Discharge date: 09/05/2015  Time spent: 45 minutes  Recommendations for Outpatient Follow-up:  -Will be discharged today. -Will be going directly to an addiction center in [place] will need follow up with cardiology upon return from rehab.   Discharge Diagnoses:  Principal Problem:   SOB (shortness of breath) Active Problems:   Acute CHF (HCC)   HTN (hypertension)   Acute combined systolic and diastolic CHF, NYHA class 1 (HCC)   Discharge Condition: Stable and improved  Filed Weights   09/03/15 0500 09/04/15 0630 09/05/15 0547  Weight: 134.7 kg (297 lb) (!) 136.6 kg (301 lb 1.6 oz) (!) 136.7 kg (301 lb 6.4 oz)    History of present illness:  Edward Burton is a 55 y.o. male with h/o HTN and ?asthma as well as tobacco abuse who has been having SOB for the past week that has progressively gotten worse. Has had some mild central chest pressure today that began at rest. CXR with vasc congestion, CT angio negative for PE or PNA. Trop 0.13, EKG with in/lat changes (EDP discussed with Dr. Lovena Le who agrees these changes are chronic  Hospital Course:   Acute Combined CHF -ECHO with EF 35-40% and moderate LVH. -Is 1.0 L negative since admission, volume status compensated. -Lasix 40 mg daily. -Continue ACE-I, stop newly started BB due to active cocaine use. -Discussed with cardiology: ACE-I and follow up with them upon return from rehab.  SOB -Improved, likely related to acute CHF.  HTN -Well controlled on current medications.  Elevated troponin -Flat, No CP. -EKG with inf/lat TWC that are old. -Due to cocaine use and acute CHF.  Morbid Obesity -Counseled on weight loss, diet modification and exercise.  Polysubstance Abuse -Including ETOH and cocaine. -Counseling provided. -He has requested inpatient rehab and  will be going there today.    Procedures:  ECHO as above   Consultations:  Cardiology via phone  Discharge Instructions  Discharge Instructions    Diet - low sodium heart healthy    Complete by:  As directed   Increase activity slowly    Complete by:  As directed       Medication List    STOP taking these medications   hydrochlorothiazide 25 MG tablet Commonly known as:  HYDRODIURIL   ibuprofen 800 MG tablet Commonly known as:  ADVIL,MOTRIN     TAKE these medications   albuterol 108 (90 Base) MCG/ACT inhaler Commonly known as:  PROVENTIL HFA;VENTOLIN HFA Inhale 1-2 puffs into the lungs every 6 (six) hours as needed for wheezing or shortness of breath.   ALPRAZolam 0.5 MG tablet Commonly known as:  XANAX Take 0.5 mg by mouth at bedtime.   aspirin 81 MG EC tablet Take 1 tablet (81 mg total) by mouth daily.   furosemide 40 MG tablet Commonly known as:  LASIX Take 1 tablet (40 mg total) by mouth daily.   lisinopril 5 MG tablet Commonly known as:  PRINIVIL,ZESTRIL Take 1 tablet (5 mg total) by mouth daily.   nicotine 14 mg/24hr patch Commonly known as:  NICODERM CQ - dosed in mg/24 hours Place 1 patch (14 mg total) onto the skin daily.      Allergies  Allergen Reactions  . Penicillins Other (See Comments)    BURNING SENSATION Has patient had a PCN reaction causing immediate rash, facial/tongue/throat swelling, SOB or lightheadedness with hypotension: No  Has patient had a PCN reaction causing severe rash involving mucus membranes or skin necrosis: No Has patient had a PCN reaction that required hospitalization No Has patient had a PCN reaction occurring within the last 10 years: No If all of the above answers are "NO", then may proceed with Cephalosporin use.    Follow-up Information    Carlyle Dolly, MD. Schedule an appointment as soon as possible for a visit today.   Specialty:  Cardiology Why:  Schedule appointment when you return from  rehab. Contact information: [address] [place] [address] [phone]            The results of significant diagnostics from this hospitalization (including imaging, microbiology, ancillary and laboratory) are listed below for reference.    Significant Diagnostic Studies: Ct Angio Chest Pe W And/or Wo Contrast  Result Date: 09/02/2015 CLINICAL DATA:  Chest tightness.  Dry cough, COPD.  Smoker. EXAM: CT ANGIOGRAPHY CHEST WITH CONTRAST TECHNIQUE: Multidetector CT imaging of the chest was performed using the standard protocol during bolus administration of intravenous contrast. Multiplanar CT image reconstructions and MIPs were obtained to evaluate the vascular anatomy. CONTRAST:  100 mL Isovue 370 COMPARISON:  08/20/2013 FINDINGS: Cardiovascular: No filling defects within pulmonary suggest acute pulmonary embolism. No acute findings of the aorta or great vessels. No pericardial fluid. Mediastinum/Nodes: No axillary supraclavicular adenopathy. No mediastinal adenopathy. Esophagus normal. Lungs/Pleura: Mild atelectasis at the LEFT lung base. No airspace disease. No pneumothorax. No suspicious nodularity. Upper Abdomen: Adrenal glands are normal. Low-density lesion in the LEFT kidney likely represents a cyst. Musculoskeletal: No aggressive osseous lesion. Review of the MIP images confirms the above findings. IMPRESSION: 1. No evidence acute pulmonary embolism. 2. No acute pulmonary parenchymal findings. Electronically Signed   By: Suzy Bouchard M.D.   On: 09/02/2015 09:59   Dg Chest Portable 1 View  Result Date: 09/02/2015 CLINICAL DATA:  Shortness of breath and wheezing, chest pain last night. Intermittent cough for 2 weeks. History of hypertension, diabetes in LEFT pneumothorax. EXAM: PORTABLE CHEST 1 VIEW COMPARISON:  Chest radiograph October 21, 2014 FINDINGS: Cardiac silhouette is upper limits of normal in size, mediastinal silhouette is nonsuspicious. Pulmonary vascular congestion  without pleural effusion or focal consolidation. No pneumothorax. Multiple old LEFT rib fractures. IMPRESSION: Borderline cardiomegaly, pulmonary vascular congestion. Electronically Signed   By: Elon Alas M.D.   On: 09/02/2015 06:46    Microbiology: No results found for this or any previous visit (from the past 240 hour(s)).   Labs: Basic Metabolic Panel:  Recent Labs Lab 09/02/15 0622 09/02/15 2324 09/04/15 0526 09/05/15 0603  NA 137 137 138 139  K 3.7 4.0 3.4* 3.5  CL 107 100* 99* 102  CO2 26 28 30 30   GLUCOSE 115* 139* 157* 119*  BUN 23* 20 24* 22*  CREATININE 1.14 0.95 0.92 0.99  CALCIUM 8.4* 9.4 8.5* 8.4*   Liver Function Tests: No results for input(s): AST, ALT, ALKPHOS, BILITOT, PROT, ALBUMIN in the last 168 hours. No results for input(s): LIPASE, AMYLASE in the last 168 hours. No results for input(s): AMMONIA in the last 168 hours. CBC:  Recent Labs Lab 09/02/15 0622  WBC 10.4  NEUTROABS 6.7  HGB 12.6*  HCT 39.4  MCV 87.6  PLT 192   Cardiac Enzymes:  Recent Labs Lab 09/02/15 0622 09/02/15 1728 09/02/15 2324  TROPONINI 0.13* 0.08* 0.08*   BNP: BNP (last 3 results)  Recent Labs  09/02/15 0622  BNP 24.0    ProBNP (last  3 results) No results for input(s): PROBNP in the last 8760 hours.  CBG: No results for input(s): GLUCAP in the last 168 hours.     SignedLelon Frohlich  [facility] Pager: [phone] 09/05/2015, 2:55 PM

## 2015-09-05 NOTE — Clinical Social Work Note (Cosign Needed)
CSW spoke with Adela Lank and [name] who advised that after speaking with patient and reviewing options that they feel that [facility] inpatient treatment program in [place] would be the best option.     CSW spoke with Zenia Resides at [facility] who advised that patient had a $3250 deductible but he had made a concession with the family advising that they would bill the deductible on the "back end." CSW discussed patient's job needing short term disability.  Zenia Resides advised that the facility would complete the forms during patient's admission.  Zenia Resides advised that admissions staff had discussed this information with patient and his sister.    Sarai January, Clydene Pugh, LCSW

## 2015-09-05 NOTE — Clinical Social Work Note (Signed)
Late entry for 09/04/15 Clinical Social Work Assessment  Patient Details  Name: Edward Burton MRN: [id number] Date of Birth: June 19, 1960  Date of referral:  09/04/2015               Reason for consult:  Substance Use/ETOH Abuse                Permission sought to share information with:    Permission granted to share information::     Name::        Agency::     Relationship::     Contact Information:  Girlfriend, [name] and sister and brother-in-law [name] and [name] were at bedside. Patient gave permission for CSW to discuss SA issues in their presence.    Housing/Transportation Living arrangements for the past 2 months:  [place] of Information:  Patient, Other (Comment Required) [name], girlfriend; [place] and Chibou [place], sister and brother-in-law) Patient Interpreter Needed:  None Criminal Activity/Legal Involvement Pertinent to Current Situation/Hospitalization:  No - Comment as needed Significant Relationships:  Adult Children, Significant Other, Siblings, Other Family Members Lives with:  Significant Other Do you feel safe going back to the place where you live?  Yes Need for family participation in patient care:  Yes (Comment)  Care giving concerns:  None identified.     Social Worker assessment / plan:  CSW met with patient and discussed the reason for the referrral was SA issues.  Patient works full time, has no previous SA treatment history, and has had episodes of being sober that varies from months to years.  He stated that he has recently been using for the past few months.  He stated that his issues with addiction have been ongoing since age 77. CSW discussed treatment readiness and options. Patient was agreeable to inpatient treatment. CSW faxed out referrals to requested facilities ([facility]).   Employment status:  [employer] information:  Managed  Medicare PT Recommendations:    Information / Referral to community resources:  Outpatient Substance Abuse Treatment Options, Residential Substance Abuse Treatment Options  Patient/Family's Response to care:  Patient is agreeable to go to inpatient SA treatment. He and his family are speaking with referral facilities reviewing options to make the choice that fits patient's needs most closely.   Patient/Family's Understanding of and Emotional Response to Diagnosis, Current Treatment, and Prognosis:  Patient and family understand patient's diagnosis, treatment and prognosis.   Emotional Assessment Appearance:  Appears stated age Attitude/Demeanor/Rapport:   (Cooperative/pleasant) Affect (typically observed):  Accepting Orientation:  Oriented to Self, Oriented to Place, Oriented to  Time, Oriented to Situation Alcohol / Substance use:  Tobacco Use Psych involvement (Current and /or in the community):  No (Comment)  Discharge Needs  Concerns to be addressed:  Substance Abuse Concerns Readmission within the last 30 days:  No Current discharge risk:  Substance Abuse Barriers to Discharge:  No Barriers Identified   Ihor Gully, LCSW 09/05/2015, 11:44 AM

## 2015-09-05 NOTE — Progress Notes (Signed)
Pt's IV site removed and intact. Pt's IV site clean dry and intact. Discharge instructions including medications and follow up appointments were reviewed and discussed with patient's caregiver. All questions were answered and no further questions at this time. Pt's caregiver verbalized understanding of discharge instructions including medications and follow up appointments. Pt in stable condition and in no acute distress at time of discharge. Pt will be escorted by nurse tech.

## 2015-11-01 ENCOUNTER — Ambulatory Visit: Payer: PRIVATE HEALTH INSURANCE | Admitting: Cardiology

## 2017-03-24 ENCOUNTER — Encounter: Payer: Self-pay | Admitting: Family Medicine

## 2017-03-24 ENCOUNTER — Ambulatory Visit (INDEPENDENT_AMBULATORY_CARE_PROVIDER_SITE_OTHER): Payer: PRIVATE HEALTH INSURANCE | Admitting: Family Medicine

## 2017-03-24 ENCOUNTER — Ambulatory Visit: Payer: PRIVATE HEALTH INSURANCE | Admitting: Family Medicine

## 2017-03-24 VITALS — BP 128/72 | HR 68 | Temp 97.4°F | Ht 71.0 in | Wt 314.0 lb

## 2017-03-24 DIAGNOSIS — G8929 Other chronic pain: Secondary | ICD-10-CM | POA: Diagnosis not present

## 2017-03-24 DIAGNOSIS — M546 Pain in thoracic spine: Secondary | ICD-10-CM | POA: Diagnosis not present

## 2017-03-24 DIAGNOSIS — Z8639 Personal history of other endocrine, nutritional and metabolic disease: Secondary | ICD-10-CM

## 2017-03-24 DIAGNOSIS — E785 Hyperlipidemia, unspecified: Secondary | ICD-10-CM | POA: Diagnosis not present

## 2017-03-24 DIAGNOSIS — I10 Essential (primary) hypertension: Secondary | ICD-10-CM | POA: Diagnosis not present

## 2017-03-24 DIAGNOSIS — I509 Heart failure, unspecified: Secondary | ICD-10-CM

## 2017-03-24 DIAGNOSIS — J449 Chronic obstructive pulmonary disease, unspecified: Secondary | ICD-10-CM | POA: Diagnosis not present

## 2017-03-24 DIAGNOSIS — I5041 Acute combined systolic (congestive) and diastolic (congestive) heart failure: Secondary | ICD-10-CM | POA: Diagnosis not present

## 2017-03-24 DIAGNOSIS — T7840XA Allergy, unspecified, initial encounter: Secondary | ICD-10-CM

## 2017-03-24 DIAGNOSIS — J309 Allergic rhinitis, unspecified: Secondary | ICD-10-CM | POA: Insufficient documentation

## 2017-03-24 LAB — CBC
HCT: 37.9 % — ABNORMAL LOW (ref 39.0–52.0)
Hemoglobin: 12.1 g/dL — ABNORMAL LOW (ref 13.0–17.0)
MCHC: 32 g/dL (ref 30.0–36.0)
MCV: 85.6 fl (ref 78.0–100.0)
PLATELETS: 213 10*3/uL (ref 150.0–400.0)
RBC: 4.43 Mil/uL (ref 4.22–5.81)
RDW: 14.5 % (ref 11.5–15.5)
WBC: 5.6 10*3/uL (ref 4.0–10.5)

## 2017-03-24 LAB — COMPREHENSIVE METABOLIC PANEL
ALBUMIN: 3.9 g/dL (ref 3.5–5.2)
ALT: 19 U/L (ref 0–53)
AST: 14 U/L (ref 0–37)
Alkaline Phosphatase: 46 U/L (ref 39–117)
BUN: 14 mg/dL (ref 6–23)
CALCIUM: 9.3 mg/dL (ref 8.4–10.5)
CHLORIDE: 101 meq/L (ref 96–112)
CO2: 30 meq/L (ref 19–32)
CREATININE: 0.82 mg/dL (ref 0.40–1.50)
GFR: 125.05 mL/min (ref >60.00)
Glucose, Bld: 163 mg/dL — ABNORMAL HIGH (ref 70–99)
Potassium: 4 mEq/L (ref 3.5–5.1)
Sodium: 137 mEq/L (ref 135–145)
Total Bilirubin: 0.5 mg/dL (ref 0.2–1.2)
Total Protein: 7.9 g/dL (ref 6.0–8.3)

## 2017-03-24 LAB — LIPID PANEL
CHOL/HDL RATIO: 4
CHOLESTEROL: 168 mg/dL (ref 0–200)
HDL: 38.1 mg/dL — AB (ref >39.00)
LDL CALC: 109 mg/dL — AB (ref 0–99)
NonHDL: 129.91
TRIGLYCERIDES: 107 mg/dL (ref 0.0–149.0)
VLDL: 21.4 mg/dL (ref 0.0–40.0)

## 2017-03-24 MED ORDER — LEVOFLOXACIN 500 MG PO TABS: 500.0000 mg | ORAL_TABLET | Freq: Every day | ORAL | 0 refills | Status: DC

## 2017-03-24 MED ORDER — GABAPENTIN 300 MG PO CAPS: 300.0000 mg | ORAL_CAPSULE | Freq: Two times a day (BID) | ORAL | 11 refills | Status: DC

## 2017-03-24 MED ORDER — METHYLPREDNISOLONE ACETATE 80 MG/ML IJ SUSP
80.0000 mg | Freq: Once | INTRAMUSCULAR | Status: AC
  Administered 2017-03-24: 80 mg via INTRAMUSCULAR

## 2017-03-24 NOTE — Assessment & Plan Note (Signed)
Referral placed to allergist. 

## 2017-03-24 NOTE — Patient Instructions (Signed)
Please start the levaquin.  Please use your rescue inhaler every 6 hours while awake the next couple of days.   We will check blood work today.   I will place a referral to the allergist and to the back doctor.  Come back to see me in 6 months, or sooner as needed.  Take care, Dr Jerline Pain

## 2017-03-24 NOTE — Assessment & Plan Note (Signed)
Symptoms consistent with COPD flare.  We will start course of Levaquin today.  We will also give 80 mg IM Depo-Medrol today.  Continue Dulera.  Advised patient to use rescue inhaler every 4-6 hours over the next couple of days.  We will obtain records from previous PCP.  Will likely need PFTs and/or referral to pulmonology soon.

## 2017-03-24 NOTE — Assessment & Plan Note (Signed)
No obvious signs of volume overload today, however we do not have prior weights to compare.  No crackles on his lung exam.  Continue current regimen.  He will follow-up with his cardiologist in 3 months.

## 2017-03-24 NOTE — Assessment & Plan Note (Signed)
Discussed lifestyle modifications.  We will also place referral to cardiopulmonary rehab for his underlying CHF and COPD.

## 2017-03-24 NOTE — Assessment & Plan Note (Signed)
Check lipid panel today 
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
## 2017-03-24 NOTE — Assessment & Plan Note (Signed)
Currently diet controlled.  Check fasting glucose on CMET.

## 2017-03-24 NOTE — Assessment & Plan Note (Signed)
At goal.  Continue current meds.  Check CMET today.

## 2017-03-24 NOTE — Assessment & Plan Note (Addendum)
Referral placed to orthopedist.

## 2017-03-25 ENCOUNTER — Telehealth: Payer: Self-pay | Admitting: Family Medicine

## 2017-03-25 ENCOUNTER — Other Ambulatory Visit: Payer: Self-pay

## 2017-03-25 MED ORDER — GABAPENTIN 300 MG PO CAPS: 300.0000 mg | ORAL_CAPSULE | Freq: Two times a day (BID) | ORAL | 11 refills | Status: DC

## 2017-03-25 NOTE — Telephone Encounter (Signed)
See note

## 2017-03-25 NOTE — Telephone Encounter (Signed)
Neurontin LOV: 03/24/17 PCP: Dr Dimas Chyle [facility] [address] [place], [place]

## 2017-03-25 NOTE — Telephone Encounter (Signed)
Copied from [place]. Topic: Quick Communication - Rx Refill/Question >> Mar 25, 2017  8:32 AM Robina Ade, Helene Kelp D wrote: Medication: gabapentin (NEURONTIN) 300 MG capsule and LevoFLOXacin    Has the patient contacted their pharmacy? No needs it at a different pharmacy sent to. It was send to the wrong pharmacy.   (Agent: If no, request that the patient contact the pharmacy for the refill.)   Preferred Pharmacy (with phone number or street name): [id number] [place], [place] [address]   Agent: Please be advised that RX refills may take up to 3 business days. We ask that you follow-up with your pharmacy.

## 2017-03-25 NOTE — Telephone Encounter (Signed)
PT called to check on status  [id number] [place], [place] [phone] (Phone) [phone] (Fax)

## 2017-03-25 NOTE — Telephone Encounter (Signed)
[facility] 

## 2017-03-26 ENCOUNTER — Other Ambulatory Visit: Payer: Self-pay

## 2017-03-26 MED ORDER — LEVOFLOXACIN 500 MG PO TABS: 500.0000 mg | ORAL_TABLET | Freq: Every day | ORAL | 0 refills | Status: DC

## 2017-03-26 NOTE — Telephone Encounter (Signed)
See note

## 2017-03-26 NOTE — Telephone Encounter (Signed)
Rx has been sent to the pharmacy

## 2017-03-26 NOTE — Telephone Encounter (Signed)
Patient spouse is calling back and states the LevoFlaxocin did not get sent to the right pharmacy only the gabapentin. Please advise.    [id number] [place] [address]

## 2017-04-04 ENCOUNTER — Encounter: Payer: Self-pay | Admitting: Physical Therapy

## 2017-04-07 ENCOUNTER — Telehealth (HOSPITAL_COMMUNITY): Payer: Self-pay

## 2017-04-07 ENCOUNTER — Encounter: Payer: Self-pay | Admitting: Allergy and Immunology

## 2017-04-07 NOTE — Telephone Encounter (Signed)
Patients insurance is active and benefits verified through [facility] - No co-pay, deductible amount of $750.00/$575.92 has been met, out of pocket amount of $3,050/$645.92 has been met, 20% co-insurance, and no pre-authorization is required. Reference #[id number]

## 2017-04-08 ENCOUNTER — Telehealth (HOSPITAL_COMMUNITY): Payer: Self-pay

## 2017-04-08 NOTE — Telephone Encounter (Signed)
Called and spoke with patient in regards to Cardiac Rehab - Patient is interested in the program. Although patient lives in [place]. Patient stated he is going to talk with his girl friend and return my phone call. Will follow up if no call.

## 2017-04-11 ENCOUNTER — Telehealth (HOSPITAL_COMMUNITY): Payer: Self-pay | Admitting: *Deleted

## 2017-04-11 NOTE — Telephone Encounter (Signed)
eceived call from patients "wife/ Freda Munro" who wished to schedule pt for cardiac rehab.  Reviewed insurance benefits.  Pt wife admitted that right now they could not afford the 20% co insurance.  Pt deductible and out of pocket have not at this point been met.  Advised that we would recheck insurance benefits in a couple of weeks in hope that with the hospital bill the out of pocket and deductible will be met.  Pt wife agreed. Cherre Huger, BSN Cardiac and Training and development officer

## 2017-04-18 ENCOUNTER — Other Ambulatory Visit: Payer: Self-pay

## 2017-04-18 MED ORDER — GABAPENTIN 300 MG PO CAPS: 300.0000 mg | ORAL_CAPSULE | Freq: Two times a day (BID) | ORAL | 3 refills | Status: DC

## 2017-04-21 ENCOUNTER — Telehealth (HOSPITAL_COMMUNITY): Payer: Self-pay

## 2017-04-21 NOTE — Telephone Encounter (Signed)
Attempted to call patient to inform patient of insurance and follow up in regards to scheduling for Cardiac Rehab - Lm on vm

## 2017-05-19 ENCOUNTER — Telehealth (HOSPITAL_COMMUNITY): Payer: Self-pay

## 2017-05-19 NOTE — Telephone Encounter (Signed)
Attempted to call patient twice in regards to Cardiac Rehab and phone kept ringing.

## 2017-05-20 ENCOUNTER — Telehealth: Payer: Self-pay | Admitting: Family Medicine

## 2017-05-20 NOTE — Telephone Encounter (Signed)
Copied from [place] [phone]. Topic: Quick Communication - Rx Refill/Question >> May 20, 2017  4:03 PM Bea Graff, NT wrote: Medication: furosemide (LASIX) 40 MG tablet  Has the patient contacted their pharmacy? Yes.   (Agent: If no, request that the patient contact the pharmacy for the refill.) Preferred Pharmacy (with phone number or street name): [id number] [place], [place] [phone] (Phone) [phone] (Fax)     Agent: Please be advised that RX refills may take up to 3 business days. We ask that you follow-up with your pharmacy.

## 2017-05-21 NOTE — Telephone Encounter (Signed)
See note

## 2017-05-21 NOTE — Telephone Encounter (Signed)
Started 09/04/2015 by Dr. Deniece Ree. Please advise

## 2017-05-21 NOTE — Telephone Encounter (Signed)
Refill for Lasix 40 mg tablet, prescription expired on 09/04/16 Prescribed by Lelon Frohlich, MD  LOV  03/24/17 NOV  06/26/17  Dr. Zack [facility]al [facility] [phone]   [place], [place]

## 2017-05-22 NOTE — Telephone Encounter (Signed)
[place] with me. Please place any necessary orders.'  Algis Greenhouse. Jerline Pain, MD 05/22/2017 8:11 AM

## 2017-05-23 ENCOUNTER — Other Ambulatory Visit: Payer: Self-pay

## 2017-05-23 MED ORDER — FUROSEMIDE 40 MG PO TABS: 40.0000 mg | ORAL_TABLET | Freq: Every day | ORAL | 2 refills | Status: DC

## 2017-05-23 NOTE — Telephone Encounter (Signed)
Rx has been sent to pharmacy

## 2017-06-11 ENCOUNTER — Telehealth (HOSPITAL_COMMUNITY): Payer: Self-pay

## 2017-06-11 NOTE — Telephone Encounter (Signed)
Called and spoke with in regards to Cardiac Rehab - Patient stated he is currently participating in [employer]. Closed referral.

## 2017-06-13 ENCOUNTER — Encounter: Payer: Self-pay | Admitting: Family Medicine

## 2017-06-13 ENCOUNTER — Ambulatory Visit (INDEPENDENT_AMBULATORY_CARE_PROVIDER_SITE_OTHER): Payer: PRIVATE HEALTH INSURANCE | Admitting: Family Medicine

## 2017-06-13 VITALS — BP 132/78 | HR 84 | Temp 98.4°F | Ht 71.0 in | Wt 312.4 lb

## 2017-06-13 DIAGNOSIS — J449 Chronic obstructive pulmonary disease, unspecified: Secondary | ICD-10-CM | POA: Diagnosis not present

## 2017-06-13 DIAGNOSIS — I10 Essential (primary) hypertension: Secondary | ICD-10-CM | POA: Diagnosis not present

## 2017-06-13 MED ORDER — ALBUTEROL SULFATE HFA 108 (90 BASE) MCG/ACT IN AERS: 1.0000 | INHALATION_SPRAY | Freq: Four times a day (QID) | RESPIRATORY_TRACT | 1 refills | Status: DC | PRN

## 2017-06-13 MED ORDER — METHYLPREDNISOLONE ACETATE 80 MG/ML IJ SUSP
80.0000 mg | Freq: Once | INTRAMUSCULAR | Status: AC
Start: 2017-06-13 — End: 2017-06-13
  Administered 2017-06-13: 80 mg via INTRAMUSCULAR

## 2017-06-13 MED ORDER — LEVOFLOXACIN 500 MG PO TABS: 500.0000 mg | ORAL_TABLET | Freq: Every day | ORAL | 0 refills | Status: DC

## 2017-06-13 MED ORDER — BENZONATATE 200 MG PO CAPS: 200.0000 mg | ORAL_CAPSULE | Freq: Two times a day (BID) | ORAL | 0 refills | Status: DC | PRN

## 2017-06-13 NOTE — Progress Notes (Signed)
   Subjective:  Edward Burton is a 57 y.o. male who presents today for same-day appointment with a chief complaint of cough.   HPI:  Cough, acute problem Started 5 days ago.  Worsened over that time.  Associated with chills, productive cough, chest congestion, wheeze, and shortness of breath.  His grandson has been sick with similar symptoms.  Had similar symptoms a couple months ago and was treated with Levaquin.  He has a history of COPD.  Symptoms are worse at night and in the mornings.  No other obvious alleviating or aggravating factors.  Wt Readings from Last 3 Encounters:  06/13/17 (!) 312 lb 6.4 oz (141.7 kg)  03/24/17 (!) 314 lb (142.4 kg)  09/05/15 (!) 301 lb 6.4 oz (136.7 kg)   Hypertension, Established problem, stable Currently on lisinopril 10 mg daily, and coreg 6.25 mg twice daily.  He is tolerating both these well without side effects.   ROS: Per HPI  PMH: He reports that he has never smoked. He has never used smokeless tobacco. He reports that he does not drink alcohol or use drugs.  Objective:  Physical Exam: BP 132/78 (BP Location: Right Arm, Patient Position: Sitting, Cuff Size: Large)   Pulse 84   Temp 98.4 F (36.9 C) (Oral)   Ht 5\' 11"  (1.803 m)   Wt (!) 312 lb 6.4 oz (141.7 kg)   SpO2 97%   BMI 43.57 kg/m   Gen: NAD, resting comfortably CV: RRR with no murmurs appreciated Pulm: NWOB patient in full sentences.  Diffuse rhonchi and wheezes noted.  Assessment/Plan:  HTN (hypertension) At goal.  Continue lisinopril and Coreg.  COPD (chronic obstructive pulmonary disease) (HCC) Symptoms consistent with COPD exacerbation.  Will start a course of Levaquin today.  Also refill his albuterol inhaler.  We will give 80 mg of IM Depo-Medrol today.  Patient has allergy test in 3 days and does not want to be on any medications that could interfere with this.  Encouraged good oral hydration.  Given that he has had recurrent COPD exacerbations within the past few  months, will place referral to pulmonology for further evaluation and management.  Edward Burton. Jerline Pain, MD 06/13/2017 2:12 PM

## 2017-06-13 NOTE — Assessment & Plan Note (Signed)
Symptoms consistent with COPD exacerbation.  Will start a course of Levaquin today.  Also refill his albuterol inhaler.  We will give 80 mg of IM Depo-Medrol today.  Patient has allergy test in 3 days and does not want to be on any medications that could interfere with this.  Encouraged good oral hydration.  Given that he has had recurrent COPD exacerbations within the past few months, will place referral to pulmonology for further evaluation and management.

## 2017-06-13 NOTE — Patient Instructions (Signed)
It was very nice to see you today!  I am glad that you are able to lose 2 pounds-please keep up the good work!  Please start the Levaquin.  I will send in a refill for your albuterol.  Please use every 4-6 hours over the next couple of days, then as needed.  I will also send in cough medication for you.  We will give you an injection of a medication called Depo-Medrol today.  I will send in a referral to the pulmonologist.  Please come back to see me in 6 to 12 months, or sooner as needed.  Take care, Dr Jerline Pain

## 2017-06-13 NOTE — Assessment & Plan Note (Signed)
At goal.  Continue lisinopril and Coreg.

## 2017-06-17 ENCOUNTER — Encounter: Payer: Self-pay | Admitting: Allergy & Immunology

## 2017-06-17 ENCOUNTER — Ambulatory Visit (INDEPENDENT_AMBULATORY_CARE_PROVIDER_SITE_OTHER): Payer: PRIVATE HEALTH INSURANCE | Admitting: Allergy & Immunology

## 2017-06-17 VITALS — BP 144/70 | HR 74 | Temp 98.4°F | Resp 20 | Ht 69.0 in | Wt 304.0 lb

## 2017-06-17 DIAGNOSIS — L508 Other urticaria: Secondary | ICD-10-CM

## 2017-06-17 DIAGNOSIS — J449 Chronic obstructive pulmonary disease, unspecified: Secondary | ICD-10-CM

## 2017-06-17 MED ORDER — CETIRIZINE HCL 10 MG PO TABS: 10.0000 mg | ORAL_TABLET | Freq: Every day | ORAL | 5 refills | Status: DC

## 2017-06-17 MED ORDER — FEXOFENADINE HCL 180 MG PO TABS: 180.0000 mg | ORAL_TABLET | Freq: Every day | ORAL | 5 refills | Status: DC
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
## 2017-06-17 NOTE — Patient Instructions (Addendum)
1. Asthma-COPD overlap syndrome - Lung testing looked awful today with an FEV1 of 26% expected. - This thankfully did improve with the nebulizer treatment. - We will keep you on your [facility], which you should take EVERY DAY - Daily controller medication(s): Singulair 88m daily and Dulera 200/530m two puffs twice daily with spacer - Prior to physical activity: ProAir 2 puffs 10-15 minutes before physical activity. - Rescue medications: ProAir 4 puffs every 4-6 hours as needed or DuoNeb nebulizer one vial every 4-6 hours as needed - Asthma control goals:  * Full participation in all desired activities (may need albuterol before activity) * Albuterol use two time or less a week on average (not counting use with activity) * Cough interfering with sleep two time or less a month * Oral steroids no more than once a year * No hospitalizations  2. Chronic urticaria - Your history does not have any "red flags" such as fevers, joint pains, or permanent skin changes that would be concerning for a more serious cause of hives.  - We will get some labs to rule out serious causes of hives: complete blood count, tryptase level, chronic urticaria panel, ESR, and CRP. - Chronic hives are often times a self limited process and will "burn themselves out" over 6-12 months, although this is not always the case.  - In the meantime, start suppressive dosing of antihistamines:   - Morning: Allegra (fexofenadine) 180-36055mone or two tablets)  - Evening: Zyrtec (cetirizine) 10-29m82mne or two tablets) - You can change this dosing at home, decreasing the dose as needed or increasing the dosing as needed.  - If you are not tolerating the medications or are tired of taking them every day, we can start treatment with a monthly injectable medication called Xolair.   3. Return in about 3 months (around 09/17/2017).   Please inform us o[place]any Emergency Department visits, hospitalizations, or changes in symptoms. Call us  [place]fore going to the ED for breathing or allergy symptoms since we might be able to fit you in for a sick visit. Feel free to contact us a[place]oreatime with any questions, problems, or concerns.  It was a pleasure to meet you today!  Websites that have reliable patient information: 1. American Academy of Asthma, Allergy, and Immunology: www.aaaai.org 2. Food Allergy Research and Education (FARE): foodallergy.org 3. Mothers of Asthmatics: http://www.asthmacommunitynetwork.org 4. American College of Allergy, Asthma, and Immunology: www.MonthlyElectricBill.co.ukake sure you are registered to vote!

## 2017-06-18 ENCOUNTER — Encounter: Payer: Self-pay | Admitting: Allergy & Immunology

## 2017-06-20 ENCOUNTER — Ambulatory Visit: Payer: Self-pay | Admitting: Allergy

## 2017-06-26 ENCOUNTER — Ambulatory Visit: Payer: PRIVATE HEALTH INSURANCE | Admitting: Family Medicine

## 2017-07-01 ENCOUNTER — Telehealth: Payer: Self-pay | Admitting: Family Medicine

## 2017-07-01 NOTE — Telephone Encounter (Signed)
Patient called, left VM to call back to discuss his cough. Patient called to the office earlier to say he developed a cough, cold and congestion and wanted to know if he needs to be seen in the office of if the antibiotic that was given the last OV can be sent to the pharmacy.  Pharmacy: [id number] - [place], [place] [phone] (Phone) [phone] (Fax)

## 2017-07-01 NOTE — Telephone Encounter (Signed)
Copied from [place] [id number]. Topic: Quick Communication - See Telephone Encounter >> Jul 01, 2017  8:00 AM Marja Kays F wrote: Pt called stated that he has developed the cough, cold and congestion and needs to know if he needs to be seen or if the antibiotics that was given the last visit patient did not have the name of the  Antibiotics because he threw the bottle away  Best number [phone]

## 2017-07-02 NOTE — Telephone Encounter (Signed)
Please advise.  Patient was just seen on 06/13/2017 for similar symptoms and given Levaquin.  He was also referred to pulmonology, but does not have appointment with them until August.

## 2017-07-02 NOTE — Telephone Encounter (Signed)
Patient has been scheduled for Friday at 4:00 pm.

## 2017-07-02 NOTE — Telephone Encounter (Signed)
Would like to see him in the office - he may benefit from a course of steroids as well but will need to evaluate him first.  Algis Greenhouse. Jerline Pain, MD 07/02/2017 8:04 AM

## 2017-07-04 ENCOUNTER — Ambulatory Visit: Payer: PRIVATE HEALTH INSURANCE | Admitting: Family Medicine

## 2017-07-14 ENCOUNTER — Ambulatory Visit (INDEPENDENT_AMBULATORY_CARE_PROVIDER_SITE_OTHER): Payer: PRIVATE HEALTH INSURANCE | Admitting: Family Medicine

## 2017-07-14 ENCOUNTER — Encounter: Payer: Self-pay | Admitting: Family Medicine

## 2017-07-14 VITALS — BP 138/74 | HR 80 | Temp 97.7°F | Ht 69.0 in | Wt 302.2 lb

## 2017-07-14 DIAGNOSIS — J449 Chronic obstructive pulmonary disease, unspecified: Secondary | ICD-10-CM

## 2017-07-14 DIAGNOSIS — R05 Cough: Secondary | ICD-10-CM | POA: Diagnosis not present

## 2017-07-14 DIAGNOSIS — J3489 Other specified disorders of nose and nasal sinuses: Secondary | ICD-10-CM

## 2017-07-14 DIAGNOSIS — R059 Cough, unspecified: Secondary | ICD-10-CM

## 2017-07-14 MED ORDER — PREDNISONE 50 MG PO TABS: ORAL_TABLET | ORAL | 0 refills | Status: DC

## 2017-07-14 MED ORDER — DOXYCYCLINE HYCLATE 100 MG PO TABS: 100.0000 mg | ORAL_TABLET | Freq: Two times a day (BID) | ORAL | 0 refills | Status: DC

## 2017-07-14 MED ORDER — IPRATROPIUM BROMIDE 0.06 % NA SOLN: 2.0000 | Freq: Four times a day (QID) | NASAL | 0 refills | Status: DC

## 2017-07-14 MED ORDER — BENZONATATE 200 MG PO CAPS: 200.0000 mg | ORAL_CAPSULE | Freq: Two times a day (BID) | ORAL | 0 refills | Status: DC | PRN

## 2017-07-14 NOTE — Progress Notes (Signed)
   Subjective:  Edward Burton is a 57 y.o. male who presents today for same-day appointment with a chief complaint of cough.   HPI:  Cough, acute problem Started several weeks ago.  Patient was started on Levaquin which helped his symptoms temporarily, however he has had a significant worsening of symptoms over the last week or so.  He has tried taking his inhaler every 4-6 hours which has not helped.  No fever or chills.  Associated symptoms include wheeze, productive sputum, rhinorrhea, and subjective shortness of breath.  No other obvious alleviating or aggravating factors.  ROS: Per HPI  PMH: He reports that he has quit smoking. His smoking use included cigarettes. He has never used smokeless tobacco. He reports that he does not drink alcohol or use drugs.  Objective:  Physical Exam: BP 138/74 (BP Location: Left Arm, Patient Position: Sitting, Cuff Size: Large)   Pulse 80   Temp 97.7 F (36.5 C) (Oral)   Ht 5\' 9"  (1.753 m)   Wt (!) 302 lb 3.2 oz (137.1 kg)   SpO2 95%   BMI 44.63 kg/m   Wt Readings from Last 3 Encounters:  07/14/17 (!) 302 lb 3.2 oz (137.1 kg)  06/17/17 (!) 304 lb (137.9 kg)  06/13/17 (!) 312 lb 6.4 oz (141.7 kg)  Gen: NAD, resting comfortably HEENT: TMs clear bilaterally.  Nasal mucosa erythematous and boggy bilaterally with clear nasal discharge.  Oropharynx erythematous without exudate. CV: RRR with no murmurs appreciated Pulm: NWOB, diffuse and expiratory wheeze.  Frequent cough.  Good airflow throughout.  Assessment/Plan:  Cough/COPD/rhinorrhea Cough likely secondary to COPD exacerbation given his wheezes and increased sputum production.  His weight is stable he has no signs of a CHF exacerbation.  We will start prednisone 50 mg daily for the next 5 days, doxycycline 100 mg twice daily for the next 7 days to treat his COPD exacerbation.  Also advised him to use albuterol inhaler every 4-6 hours for wheezing shortness of breath.  We will continue his  other COPD medications in the interim.  Start Atrovent for his rhinorrhea.  We will also start Tessalon for his cough.  Encouraged good oral hydration.  Discussed reasons to return to care.  Follow-up as needed.  Algis Greenhouse. Jerline Pain, MD 07/14/2017 2:32 PM

## 2017-07-14 NOTE — Patient Instructions (Signed)
It was very nice to see you today!  Please start the prednisone and doxycycline.  Use the tessalon for your cough.  Use the atrovent for your runny nose.  Please stay well hydrated.  Let me know if your symptoms worsen or do not improve over the next few days.  Take care, Dr Jerline Pain

## 2017-08-03 ENCOUNTER — Other Ambulatory Visit: Payer: Self-pay | Admitting: Family Medicine

## 2017-08-03 IMAGING — CT CT ANGIO CHEST
2 of 6 series · 19 of 46 positions shown · IV contrast (ISOVUE)
Comparison: 08/20/2013

CLINICAL DATA: Chest tightness.  Dry cough, COPD.  Smoker.

EXAM:
CT ANGIOGRAPHY CHEST WITH CONTRAST
TECHNIQUE: Multidetector CT imaging of the chest was performed using the
standard protocol during bolus administration of intravenous
contrast. Multiplanar CT image reconstructions and MIPs were
obtained to evaluate the vascular anatomy.
CONTRAST:  100 mL Isovue 370

[Series 5: pe thins 1.0 · axial · 0.71mm/px · z∈[+1020,+1308]mm · 16 of 322 slices shown]
[im 17/322  lung]
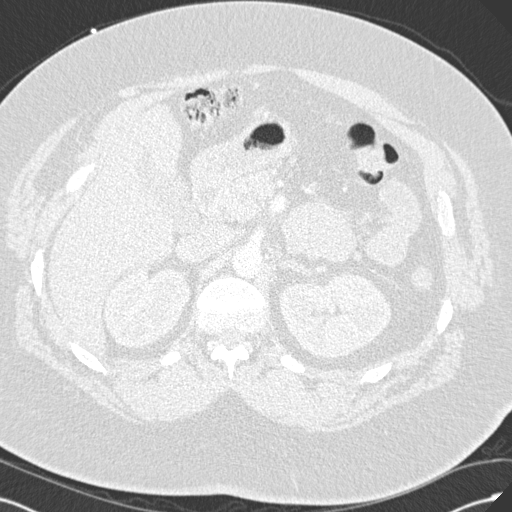
[im 33/322  soft-tissue]
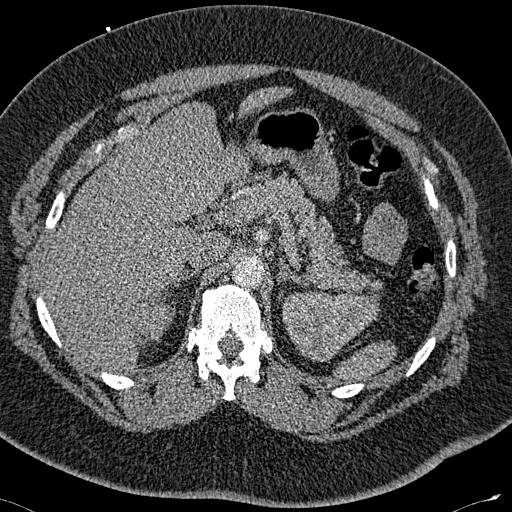
[im 49/322  lung]
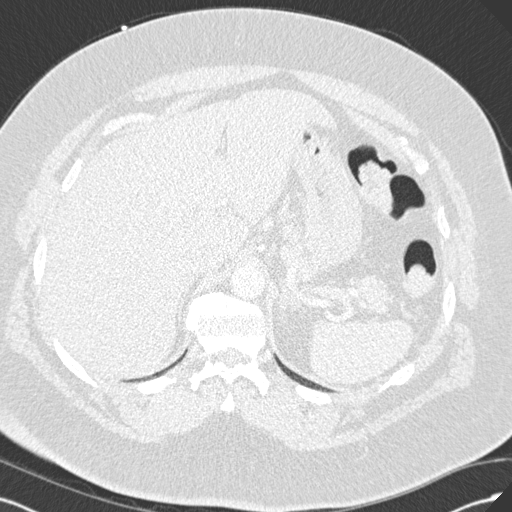
[im 81/322  soft-tissue]
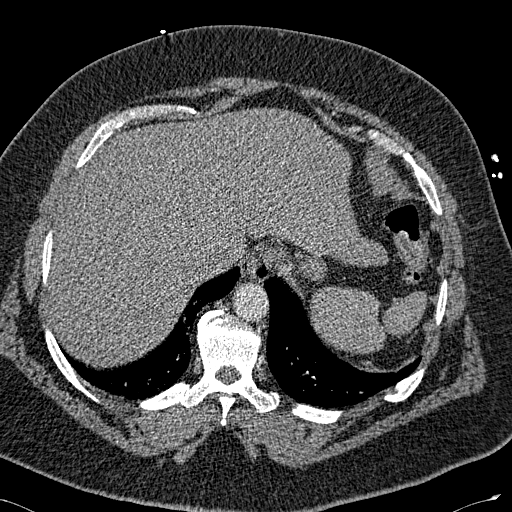
[im 97/322  lung]
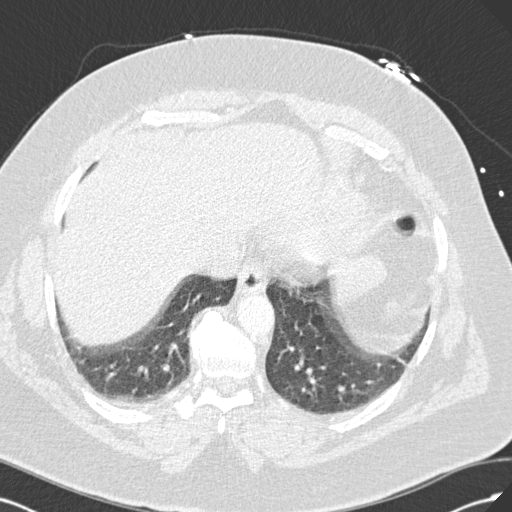
[im 113/322  soft-tissue]
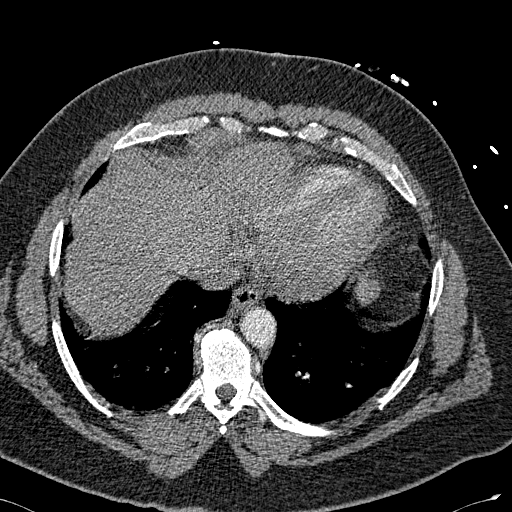
[im 129/322  lung]
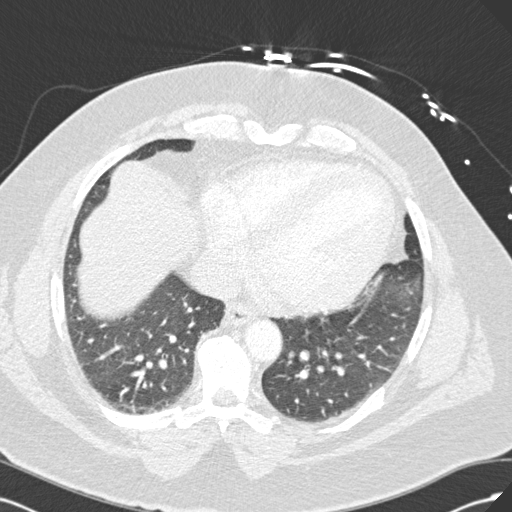
[im 145/322  soft-tissue]
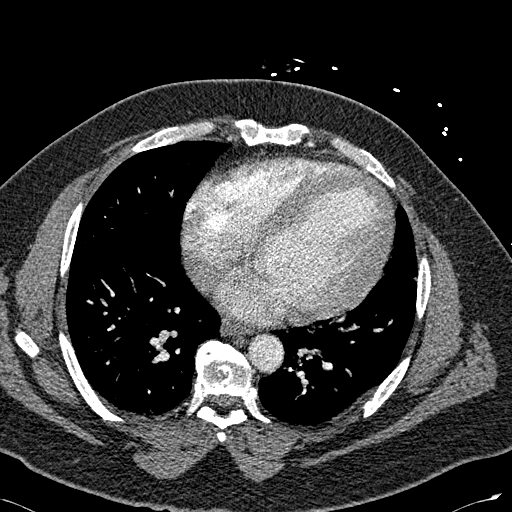
[im 177/322  lung]
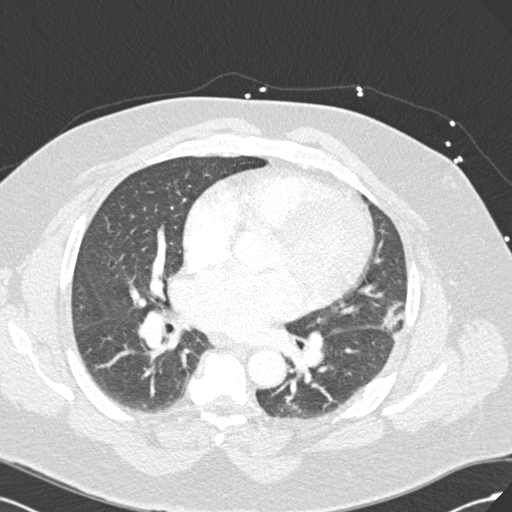
[im 193/322  soft-tissue]
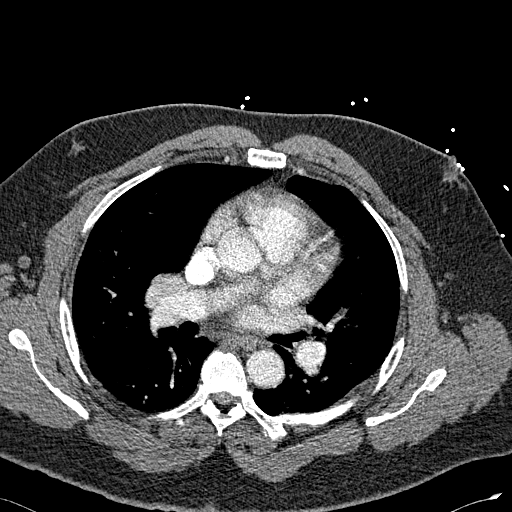
[im 209/322  lung]
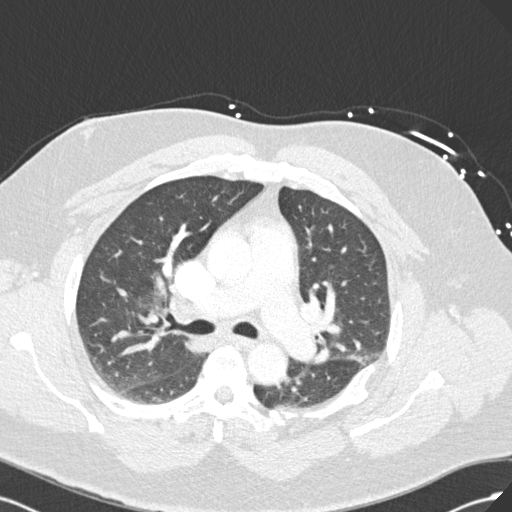
[im 225/322  soft-tissue]
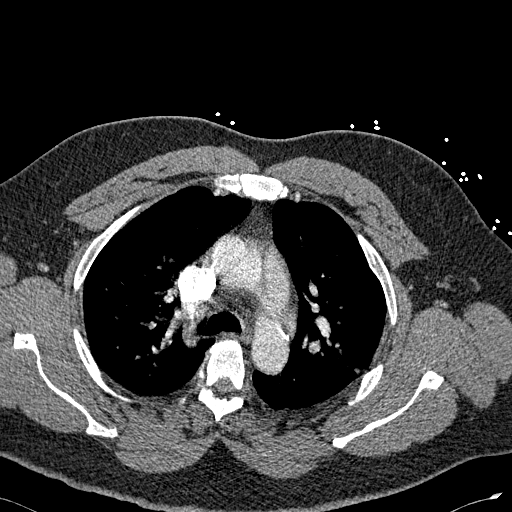
[im 241/322  lung]
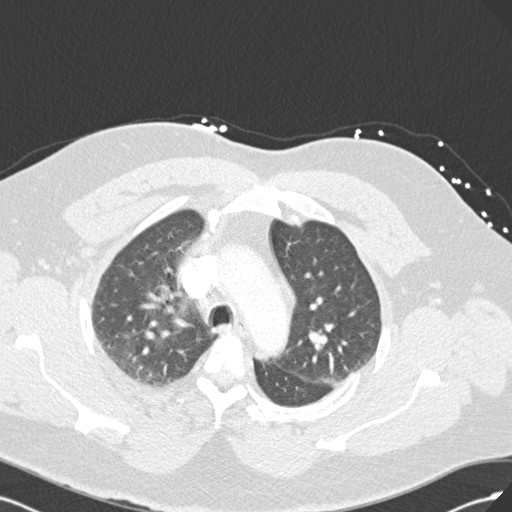
[im 273/322  soft-tissue]
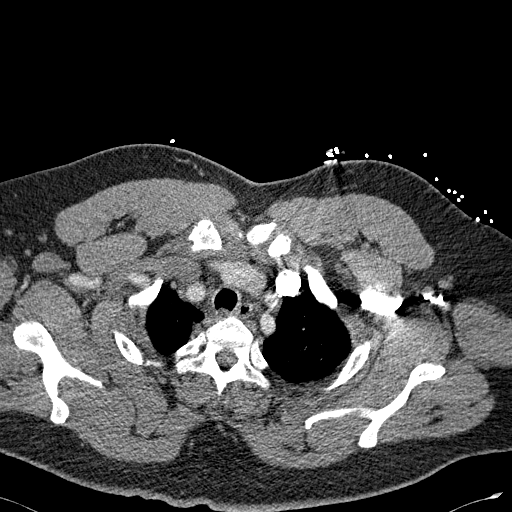
[im 289/322  lung]
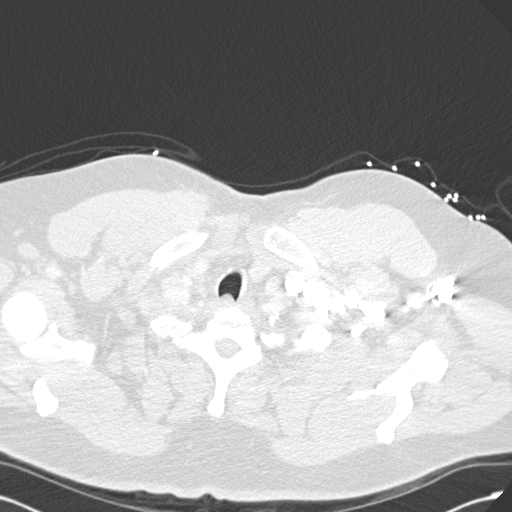
[im 305/322  soft-tissue]
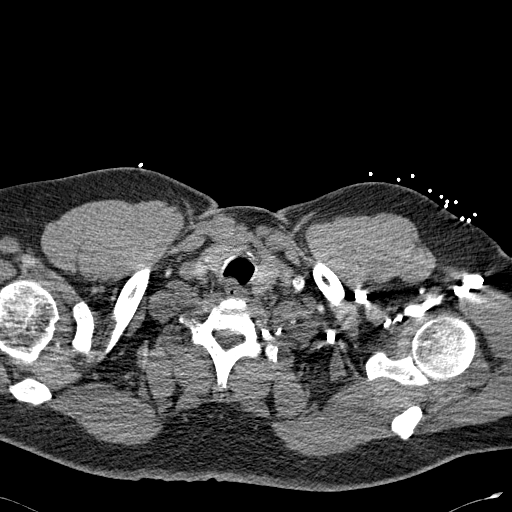

[Series 7: cor mpr 2.0 · coronal · 0.72mm/px · 3 of 143 slices shown]
[im 36/143  soft-tissue]
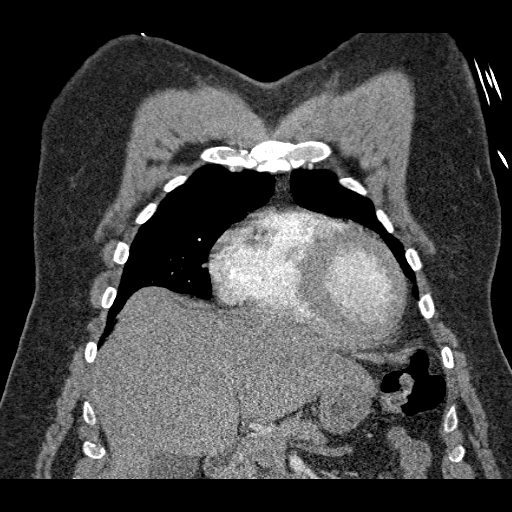
[im 72/143  soft-tissue]
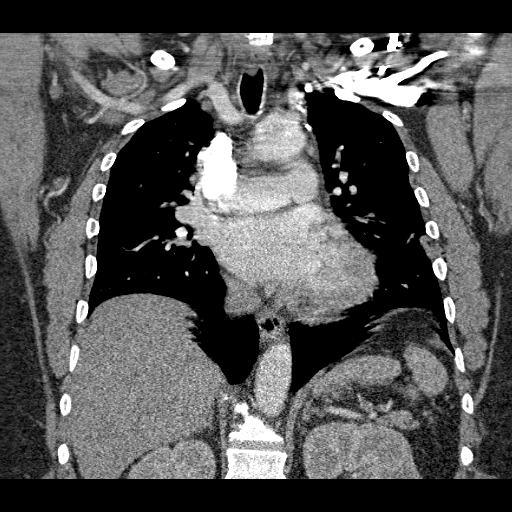
[im 107/143  soft-tissue]
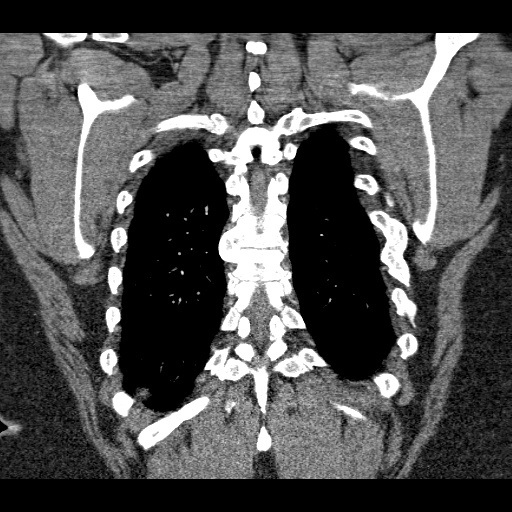

[19 of 46 positions shown; findings below may reference images not displayed]

FINDINGS: Cardiovascular: No filling defects within pulmonary suggest acute
pulmonary embolism. No acute findings of the aorta or great vessels.
No pericardial fluid.

Mediastinum/Nodes: No axillary supraclavicular adenopathy. No
mediastinal adenopathy. Esophagus normal.

Lungs/Pleura: Mild atelectasis at the LEFT lung base. No airspace
disease. No pneumothorax. No suspicious nodularity.

Upper Abdomen: Adrenal glands are normal. Low-density lesion in the
LEFT kidney likely represents a cyst.

Musculoskeletal: No aggressive osseous lesion.

Review of the MIP images confirms the above findings.
IMPRESSION: 1. No evidence acute pulmonary embolism.
2. No acute pulmonary parenchymal findings.

## 2017-08-04 ENCOUNTER — Other Ambulatory Visit: Payer: Self-pay

## 2017-08-04 MED ORDER — ALBUTEROL SULFATE HFA 108 (90 BASE) MCG/ACT IN AERS: 1.0000 | INHALATION_SPRAY | Freq: Four times a day (QID) | RESPIRATORY_TRACT | 1 refills | Status: DC | PRN

## 2017-08-04 NOTE — Progress Notes (Signed)
Rx refill for Albuterol inhaler sent to Dr. Rogers Blocker for approval.

## 2017-08-08 ENCOUNTER — Encounter: Payer: Self-pay | Admitting: Pulmonary Disease

## 2017-08-08 ENCOUNTER — Ambulatory Visit (INDEPENDENT_AMBULATORY_CARE_PROVIDER_SITE_OTHER): Payer: PRIVATE HEALTH INSURANCE | Admitting: Pulmonary Disease

## 2017-08-08 VITALS — BP 134/80 | HR 97 | Ht 71.0 in | Wt 297.0 lb

## 2017-08-08 DIAGNOSIS — R0683 Snoring: Secondary | ICD-10-CM

## 2017-08-08 DIAGNOSIS — J449 Chronic obstructive pulmonary disease, unspecified: Secondary | ICD-10-CM | POA: Diagnosis not present

## 2017-08-08 DIAGNOSIS — Z6841 Body Mass Index (BMI) 40.0 and over, adult: Secondary | ICD-10-CM

## 2017-08-08 NOTE — Patient Instructions (Signed)
Dulera two puffs in the morning and two puffs at night, and rinse mouth after each use - this is a maintenance inhaler for COPD and asthma and you need to use every day  Singulair 10 mg pill nightly - this is a maintenance medicine for asthma and you need to take every night  Albuterol or combivent as needed when you have more cough, wheeze, chest congestion, or shortness of breath  Make sure you get the lab tests done ordered by [name]  Will arrange for home sleep study  Follow up in 4 months

## 2017-08-08 NOTE — Progress Notes (Signed)
   Subjective:    Patient ID: Edward Burton, male    DOB: [birth year], 57 y.o.   MRN: [id number]  HPI    Review of Systems  Constitutional: Negative for fever and unexpected weight change.  HENT: Positive for congestion, postnasal drip and sinus pressure. Negative for dental problem, ear pain, nosebleeds, rhinorrhea, sneezing, sore throat and trouble swallowing.   Eyes: Negative for redness and itching.  Respiratory: Positive for shortness of breath and wheezing. Negative for cough and chest tightness.   Cardiovascular: Positive for leg swelling. Negative for palpitations.  Gastrointestinal: Negative for nausea and vomiting.  Genitourinary: Negative for dysuria.  Musculoskeletal: Positive for joint swelling.  Skin: Positive for rash.  Allergic/Immunologic: Positive for environmental allergies and food allergies. Negative for immunocompromised state.  Neurological: Negative for headaches.  Hematological: Does not bruise/bleed easily.  Psychiatric/Behavioral: Negative for dysphoric mood. The patient is not nervous/anxious.        Objective:   Physical Exam        Assessment & Plan:

## 2017-08-08 NOTE — Progress Notes (Signed)
[facility], Critical Care, and Sleep Medicine  Chief Complaint  Patient presents with  . New Consult    COPD, shortness of breath     Constitutional: BP 134/80 (BP Location: Left Arm, Cuff Size: Normal)   Pulse 97   Ht 5\' 11"  (1.803 m)   Wt 297 lb (134.7 kg)   SpO2 97%   BMI 41.42 kg/m   History of Present Illness: Edward Burton is a 57 y.o. male former smoker for evaluation of COPD.  He quit smoking a few months ago.  UDS from 09/03/15 was positive for cocaine.  He reports not using cocaine recently.  He has history of allergies, asthma and urticaria.  He has been seen by [name] with allergy.  He has several different inhalers, but was confused about what they were for.  He has been using albuterol and combivent.  It doesn't seem like he has been using dulera.  He reports using singulair.  He isn't having cough, wheeze, or sputum for past several days.  He was on ABx and prednisone about 2 weeks ago.  He has a hard time getting over respiratory infections.    He lives in [place].  He is on disability due to CHF and COPD.  No pets.  No history of pneumonia or TB.    He snores at night, and has been told his breathing stops while asleep.  He is a restless sleeper, and can fall asleep during the day.  Comprehensive Respiratory Exam:  Appearance - well kempt  ENMT - nasal mucosa moist, turbinates clear, midline nasal septum, no dental lesions, no gingival bleeding, no oral exudates, no tonsillar hypertrophy Neck - no masses, trachea midline, no thyromegaly, no elevation in JVP Respiratory - normal appearance of chest wall, normal respiratory effort w/o accessory muscle use, no dullness on percussion, no tactile fremitus, no wheezing or rales CV - s1s2 regular rate and rhythm, no murmurs, no peripheral edema, no varicosities, radial pulses symmetric GI - soft, non tender, no masses, no hepatosplenomegaly Lymph - no adenopathy noted in neck and axillary areas MSK -  normal muscle strength and tone, normal gait Ext - no cyanosis, clubbing, or joint inflammation noted Skin - no rashes, lesions, or ulcers Neuro - oriented to person, place, and time Psych - normal mood and affect   Assessment/Plan:  COPD with asthma. - had detailed discussion about this with him - explained different roles for his maintenance and rescue medications - explained he needs to use dulera and singulair on regular basis - explained that albuterol and combivent are to be used as needed, and when he should use these - don't think he needs additional Burton function testing at this time  Allergic rhinitis with urticaria. - f/u with [name] with allergy - advised him to get lab work ordered by [name]  Snoring with daytime sleepiness. - I am concerned he could have sleep apnea - will arrange for home sleep study  Obesity. - discussed options to assist with weight loss and explained how his weight is impacting his health  Chronic systolic CHF. - he is followed by cardiology in [place]   Patient Instructions  Dulera two puffs in the morning and two puffs at night, and rinse mouth after each use - this is a maintenance inhaler for COPD and asthma and you need to use every day  Singulair 10 mg pill nightly - this is a maintenance medicine for asthma and you need to take every night  Albuterol or combivent as needed when you have more cough, wheeze, chest congestion, or shortness of breath  Make sure you get the lab tests done ordered by [name]  Will arrange for home sleep study  Follow up in 4 months    [name], MD Edward Burton 08/08/2017, 2:28 PM  Flow Sheet  Burton tests: CT angio chest 09/02/15 >> no lung disease Spirometry 06/24/17 >> 1.30 (47%), FEV1% 57  Sleep tests:  Cardiac tests: Echo 09/03/15 >> mod LVH, EF 35 to 40%  Review of Systems: Constitutional: Negative for fever and unexpected weight change.   HENT: Positive for congestion, postnasal drip and sinus pressure. Negative for dental problem, ear pain, nosebleeds, rhinorrhea, sneezing, sore throat and trouble swallowing.   Eyes: Negative for redness and itching.  Respiratory: Positive for shortness of breath and wheezing. Negative for cough and chest tightness.   Cardiovascular: Positive for leg swelling. Negative for palpitations.  Gastrointestinal: Negative for nausea and vomiting.  Genitourinary: Negative for dysuria.  Musculoskeletal: Positive for joint swelling.  Skin: Positive for rash.  Allergic/Immunologic: Positive for environmental allergies and food allergies. Negative for immunocompromised state.  Neurological: Negative for headaches.  Hematological: Does not bruise/bleed easily.  Psychiatric/Behavioral: Negative for dysphoric mood. The patient is not nervous/anxious.    Past Medical History: He  has a past medical history of Arthritis, Asthma, Atypical chest pain, CHF (congestive heart failure) ([place]), COPD (chronic obstructive Burton disease) ([place]), DDD (degenerative disc disease), lumbar (12/26/2014), GERD (gastroesophageal reflux disease), HTN (hypertension), Pneumothorax, left, Polysubstance abuse ([place]), Recurrent upper respiratory infection (URI), Tobacco abuse, Type 2 diabetes, diet controlled ([place]), and Urticaria.  Past Surgical History: He  has a past surgical history that includes Chest tube insertion.  Family History: His family history includes Allergic rhinitis in his father and sister; Anemia in his mother; Diabetes in his father; High blood pressure in his father; Urticaria in his sister.  Social History: He  reports that he has quit smoking. His smoking use included cigarettes. He has never used smokeless tobacco. He reports that he does not drink alcohol or use drugs.  Medications: Allergies as of 08/08/2017      Reactions   Bactrim [sulfamethoxazole-trimethoprim] Anaphylaxis   Penicillins Other (See  Comments)   BURNING SENSATION Has patient had a PCN reaction causing immediate rash, facial/tongue/throat swelling, SOB or lightheadedness with hypotension: No Has patient had a PCN reaction causing severe rash involving mucus membranes or skin necrosis: No Has patient had a PCN reaction that required hospitalization No Has patient had a PCN reaction occurring within the last 10 years: No If all of the above answers are "NO", then may proceed with Cephalosporin use.   Sulfa Antibiotics    Tomato       Medication List        Accurate as of 08/08/17  2:28 PM. Always use your most recent med list.          albuterol 108 (90 Base) MCG/ACT inhaler Commonly known as:  PROVENTIL HFA;VENTOLIN HFA INHALE 1 TO 2 PUFFS EVERY 6 HOURS AS NEEDED FOR WHEEZING OR SHORTNESS OF BREATH   albuterol 108 (90 Base) MCG/ACT inhaler Commonly known as:  PROVENTIL HFA;VENTOLIN HFA Inhale 1-2 puffs into the lungs every 6 (six) hours as needed for wheezing or shortness of breath.   aspirin 81 MG EC tablet Take 1 tablet (81 mg total) by mouth daily.   benzonatate 200 MG capsule Commonly known as:  TESSALON Take 1 capsule (200 mg  total) by mouth 2 (two) times daily as needed for cough.   carvedilol 6.25 MG tablet Commonly known as:  COREG   cetirizine 10 MG tablet Commonly known as:  ZYRTEC Take 1 tablet (10 mg total) by mouth daily.   diclofenac 50 MG tablet Commonly known as:  CATAFLAM Take 50 mg by mouth as needed.   fexofenadine 180 MG tablet Commonly known as:  ALLEGRA Take 1 tablet (180 mg total) by mouth daily.   furosemide 40 MG tablet Commonly known as:  LASIX Take 1 tablet (40 mg total) by mouth daily.   gabapentin 300 MG capsule Commonly known as:  NEURONTIN Take 1 capsule (300 mg total) by mouth 2 (two) times daily.   hydrocortisone cream 1 % Apply topically.   ipratropium 0.06 % nasal spray Commonly known as:  ATROVENT Place 2 sprays into both nostrils 4 (four) times  daily.   Ipratropium-Albuterol 20-100 MCG/ACT Aers respimat Commonly known as:  COMBIVENT Inhale into the lungs.   ipratropium-albuterol 0.5-2.5 (3) MG/3ML Soln Commonly known as:  DUONEB Inhale into the lungs.   lisinopril 10 MG tablet Commonly known as:  PRINIVIL,ZESTRIL   loratadine 10 MG tablet Commonly known as:  CLARITIN Take 10 mg by mouth daily.   mometasone-formoterol 200-5 MCG/ACT Aero Commonly known as:  DULERA Inhale into the lungs.   montelukast 10 MG tablet Commonly known as:  SINGULAIR Take by mouth.

## 2017-08-19 ENCOUNTER — Other Ambulatory Visit: Payer: Self-pay | Admitting: Family Medicine

## 2017-09-09 ENCOUNTER — Telehealth: Payer: Self-pay | Admitting: Family Medicine

## 2017-09-09 NOTE — Telephone Encounter (Signed)
See note

## 2017-09-09 NOTE — Telephone Encounter (Signed)
Copied from [place] [phone]. Topic: Quick Communication - See Telephone Encounter >> Sep 09, 2017  3:54 PM Ivar Drape wrote: CRM for notification. See Telephone encounter for: 09/09/17. Patient would like some cough syrup for a cold and have it sent to [facility], [place], [place]

## 2017-09-10 NOTE — Telephone Encounter (Signed)
Yes. New rx needs appointment.  Algis Greenhouse. Jerline Pain, MD 09/10/2017 12:13 PM

## 2017-09-10 NOTE — Telephone Encounter (Signed)
Please advise.  Would you like patient to come in?

## 2017-09-10 NOTE — Telephone Encounter (Signed)
Patient has an appointment scheduled on 09/11/2017.

## 2017-09-11 ENCOUNTER — Ambulatory Visit: Payer: PRIVATE HEALTH INSURANCE | Admitting: Family Medicine

## 2017-09-30 ENCOUNTER — Ambulatory Visit: Payer: PRIVATE HEALTH INSURANCE | Admitting: Allergy & Immunology

## 2017-10-20 ENCOUNTER — Other Ambulatory Visit: Payer: Self-pay | Admitting: Allergy & Immunology

## 2017-10-24 ENCOUNTER — Encounter: Payer: Self-pay | Admitting: Allergy & Immunology

## 2017-10-24 ENCOUNTER — Other Ambulatory Visit: Payer: Self-pay | Admitting: Allergy & Immunology

## 2017-10-24 ENCOUNTER — Ambulatory Visit (INDEPENDENT_AMBULATORY_CARE_PROVIDER_SITE_OTHER): Payer: PRIVATE HEALTH INSURANCE | Admitting: Allergy & Immunology

## 2017-10-24 ENCOUNTER — Other Ambulatory Visit: Payer: Self-pay

## 2017-10-24 VITALS — BP 138/70 | HR 68 | Temp 98.6°F | Resp 20 | Ht 69.49 in | Wt 323.8 lb

## 2017-10-24 DIAGNOSIS — J449 Chronic obstructive pulmonary disease, unspecified: Secondary | ICD-10-CM | POA: Diagnosis not present

## 2017-10-24 DIAGNOSIS — T781XXD Other adverse food reactions, not elsewhere classified, subsequent encounter: Secondary | ICD-10-CM

## 2017-10-24 DIAGNOSIS — L508 Other urticaria: Secondary | ICD-10-CM | POA: Diagnosis not present

## 2017-10-24 MED ORDER — FEXOFENADINE HCL 180 MG PO TABS: 180.0000 mg | ORAL_TABLET | Freq: Every day | ORAL | 5 refills | Status: DC

## 2017-10-24 MED ORDER — BENZONATATE 200 MG PO CAPS: 200.0000 mg | ORAL_CAPSULE | Freq: Two times a day (BID) | ORAL | 0 refills | Status: DC | PRN

## 2017-10-24 MED ORDER — MOMETASONE FURO-FORMOTEROL FUM 200-5 MCG/ACT IN AERO: 2.0000 | INHALATION_SPRAY | Freq: Every day | RESPIRATORY_TRACT | 5 refills | Status: DC

## 2017-10-24 MED ORDER — IPRATROPIUM BROMIDE 0.06 % NA SOLN: 2.0000 | Freq: Four times a day (QID) | NASAL | 5 refills | Status: DC

## 2017-10-24 MED ORDER — ALBUTEROL SULFATE HFA 108 (90 BASE) MCG/ACT IN AERS: INHALATION_SPRAY | RESPIRATORY_TRACT | 1 refills | Status: DC

## 2017-10-24 MED ORDER — IPRATROPIUM-ALBUTEROL 0.5-2.5 (3) MG/3ML IN SOLN: 3.0000 mL | RESPIRATORY_TRACT | 5 refills | Status: DC | PRN

## 2017-10-24 MED ORDER — ALBUTEROL SULFATE HFA 108 (90 BASE) MCG/ACT IN AERS: 1.0000 | INHALATION_SPRAY | Freq: Four times a day (QID) | RESPIRATORY_TRACT | 1 refills | Status: DC | PRN

## 2017-10-24 MED ORDER — CETIRIZINE HCL 10 MG PO TABS: 10.0000 mg | ORAL_TABLET | Freq: Every day | ORAL | 5 refills | Status: DC

## 2017-10-24 MED ORDER — MONTELUKAST SODIUM 10 MG PO TABS: 10.0000 mg | ORAL_TABLET | Freq: Every evening | ORAL | 5 refills | Status: DC

## 2017-10-24 MED ORDER — BUDESONIDE-FORMOTEROL FUMARATE 160-4.5 MCG/ACT IN AERO: 2.0000 | INHALATION_SPRAY | Freq: Two times a day (BID) | RESPIRATORY_TRACT | 5 refills | Status: DC

## 2017-10-24 MED ORDER — IPRATROPIUM-ALBUTEROL 20-100 MCG/ACT IN AERS: 1.0000 | INHALATION_SPRAY | Freq: Four times a day (QID) | RESPIRATORY_TRACT | 5 refills | Status: DC | PRN

## 2017-10-24 NOTE — Patient Instructions (Addendum)
1. Asthma-COPD overlap syndrome - Lung testing looked awful today but this seems to be your normal.  - Sample of Dulera provided today and we will send in a new prescription.  - Daily controller medication(s): Singulair 10mg  daily and Dulera 200/52mcg two puffs twice daily with spacer - Prior to physical activity: Combivent Respimat as needed or ProAir 2 puffs 10-15 minutes before physical activity. - Rescue medications: ProAir 4 puffs every 4-6 hours as needed or DuoNeb nebulizer one vial every 4-6 hours as needed - Asthma control goals:  * Full participation in all desired activities (may need albuterol before activity) * Albuterol use two time or less a week on average (not counting use with activity) * Cough interfering with sleep two time or less a month * Oral steroids no more than once a year * No hospitalizations  2. Chronic urticaria  - Labs overall are normal, but we are still waiting for the alpha-gal panel. - Your allergy testing showed positives to dust mite, cockroach, and molds (avoidance measures provided).  - Complete blood count showed anemia (low red blood cells), which seems to be your normal. - Dr. Jerline Pain is working this up, so I will not address that today.  - We will get some blood work to look for tomatoes and pineapple allergies.  - In the meantime, continue with suppressive dosing of antihistamines:   - Morning: Allegra (fexofenadine) 180-360mg  (one or two tablets)  - Evening: Zyrtec (cetirizine) 10-20mg  (one or two tablets) - You can change this dosing at home, decreasing the dose as needed or increasing the dosing as needed.  - If you are not tolerating the medications or are tired of taking them every day, we can start treatment with a monthly injectable medication called Xolair.   3. Chronic rhinitis (dust mite, cockroach, and molds) - Continue with your nasal steroid one spray per nostril twice daily (samples of Nasacort provided, but we will have to send in  fluticasone for your insurance). - Avoidance measures for the allergens provided.   4. Return in about 4 weeks (around 11/21/2017).   Please inform us of any Emergency Department visits, hospitalizations, or changes in symptoms. Call us before going to the ED for breathing or allergy symptoms since we might be able to fit you in for a sick visit. Feel free to contact us anytime with any questions, problems, or concerns.  It was a pleasure to see you again today!  Websites that have reliable patient information: 1. American Academy of Asthma, Allergy, and Immunology: www.aaaai.org 2. Food Allergy Research and Education (FARE): foodallergy.org 3. Mothers of Asthmatics: http://www.asthmacommunitynetwork.org 4. American College of Allergy, Asthma, and Immunology: MonthlyElectricBill.co.uk   Make sure you are registered to vote!    Control of House Dust Mite Allergen    House dust mites play a major role in allergic asthma and rhinitis.  They occur in environments with high humidity wherever human skin, the food for dust mites is found. High levels have been detected in dust obtained from mattresses, pillows, carpets, upholstered furniture, bed covers, clothes and soft toys.  The principal allergen of the house dust mite is found in its feces.  A gram of dust may contain 1,000 mites and 250,000 fecal particles.  Mite antigen is easily measured in the air during house cleaning activities.    1. Encase mattresses, including the box spring, and pillow, in an air tight cover.  Seal the zipper end of the encased mattresses with wide adhesive tape. 2.  Wash the bedding in water of 130 degrees Farenheit weekly.  Avoid cotton comforters/quilts and flannel bedding: the most ideal bed covering is the dacron comforter. 3. Remove all upholstered furniture from the bedroom. 4. Remove carpets, carpet padding, rugs, and non-washable window drapes from the bedroom.  Wash drapes weekly or use plastic window  coverings. 5. Remove all non-washable stuffed toys from the bedroom.  Wash stuffed toys weekly. 6. Have the room cleaned frequently with a vacuum cleaner and a damp dust-mop.  The patient should not be in a room which is being cleaned and should wait 1 hour after cleaning before going into the room. 7. Close and seal all heating outlets in the bedroom.  Otherwise, the room will become filled with dust-laden air.  An electric heater can be used to heat the room. 8. Reduce indoor humidity to less than 50%.  Do not use a humidifier.   Control of Mold Allergen   Mold and fungi can grow on a variety of surfaces provided certain temperature and moisture conditions exist.  Outdoor molds grow on plants, decaying vegetation and soil.  The major outdoor mold, Alternaria and Cladosporium, are found in very high numbers during hot and dry conditions.  Generally, a late Summer - Fall peak is seen for common outdoor fungal spores.  Rain will temporarily lower outdoor mold spore count, but counts rise rapidly when the rainy period ends.  The most important indoor molds are Aspergillus and Penicillium.  Dark, humid and poorly ventilated basements are ideal sites for mold growth.  The next most common sites of mold growth are the bathroom and the kitchen.  Outdoor (Seasonal) Mold Control  1. Use air conditioning and keep windows closed 2. Avoid exposure to decaying vegetation. 3. Avoid leaf raking. 4. Avoid grain handling. 5. Consider wearing a face mask if working in moldy areas.    Indoor (Perennial) Mold Control    1. Maintain humidity below 50%. 2. Clean washable surfaces with 5% bleach solution. 3. Remove sources e.g. contaminated carpets.     Control of Cockroach Allergen  Cockroach allergen has been identified as an important cause of acute attacks of asthma, especially in urban settings.  There are fifty-five species of cockroach that exist in the [place], however only three, the  [place], [place] species produce allergen that can affect patients with Asthma.  Allergens can be obtained from fecal particles, egg casings and secretions from cockroaches.    1. Remove food sources. 2. Reduce access to water. 3. Seal access and entry points. 4. Spray runways with 0.5-1% Diazinon or Chlorpyrifos 5. Blow boric acid power under stoves and refrigerator. 6. Place bait stations (hydramethylnon) at feeding sites.

## 2017-10-24 NOTE — Progress Notes (Signed)
FOLLOW UP  Date of Service/Encounter:  10/24/17   Assessment:   Asthma-COPD overlap syndrome  Chronic rhinitis (dust mite, cockroach, and molds)  Adverse food reaction (tomato, pineapple)  Chronic urticaria - resolved   [name] largely remains unchanged since his last visit, but from review of his medication it seems that this from his not picking up his Dulera. He has been using his albuterol on a routine basis in combination with Combivent with continued symptoms despite this. We will ensure that the [facility] is added to his regimen. We provided a sample today as well to reinforce the need for the need for him to take this medication.  He is also being some symptoms with tomatoes and pineapple, so we will get some blood work to look at this.  His rhinitis symptoms are fairly well-controlled regimen.  His urticaria have not been much of an issue.    Plan/Recommendations:   1. Asthma-COPD overlap syndrome - Lung testing looked awful today but this seems to be your normal.  - Sample of Dulera provided today and we will send in a new prescription.  - Daily controller medication(s): Singulair 40m daily and Dulera 200/573m two puffs twice daily with spacer - Prior to physical activity: Combivent Respimat as needed or ProAir 2 puffs 10-15 minutes before physical activity. - Rescue medications: ProAir 4 puffs every 4-6 hours as needed or DuoNeb nebulizer one vial every 4-6 hours as needed - Asthma control goals:  * Full participation in all desired activities (may need albuterol before activity) * Albuterol use two time or less a week on average (not counting use with activity) * Cough interfering with sleep two time or less a month * Oral steroids no more than once a year * No hospitalizations  2. Chronic urticaria  - Labs overall are normal, but we are still waiting for the alpha-gal panel. - Your allergy testing showed positives to dust mite, cockroach, and molds (avoidance  measures provided).  - Complete blood count showed anemia (low red blood cells), which seems to be your normal. - Dr. PaJerline Pains working this up, so I will not address that today.  - We will get some blood work to look for tomatoes and pineapple allergies.  - In the meantime, continue with suppressive dosing of antihistamines:   - Morning: Allegra (fexofenadine) 180-36044mone or two tablets)  - Evening: Zyrtec (cetirizine) 10-26m9mne or two tablets) - You can change this dosing at home, decreasing the dose as needed or increasing the dosing as needed.  - If you are not tolerating the medications or are tired of taking them every day, we can start treatment with a monthly injectable medication called Xolair.   3. Chronic rhinitis (dust mite, cockroach, and molds) - Continue with your nasal steroid one spray per nostril twice daily (samples of Nasacort provided, but we will have to send in fluticasone for your insurance). - Avoidance measures for the allergens provided.   4. Return in about 4 weeks (around 11/21/2017).  Subjective:   Edward Burton 57 y27. male presenting today for follow up of  Chief Complaint  Patient presents with  . Follow-up    Edward Burton a history of the following: Patient Active Problem List   Diagnosis Date Noted  . Allergic state 03/24/2017  . Chronic left-sided thoracic back pain 03/24/2017  . History of type 2 diabetes mellitus 03/24/2017  . HTN (hypertension) 09/02/2015  . Acute combined systolic and diastolic CHF,  NYHA class 1 ([place]) 09/02/2015  . COPD (chronic obstructive pulmonary disease) ([place]) 12/14/2014  . Morbid obesity ([place]) 11/28/2014  . Ventricular ectopics 04/01/2014  . Anxiety 09/28/2011  . Hyperlipidemia 07/12/2011    History obtained from: chart review and patient.  Edward Burton Primary Care Provider is Vivi Barrack, MD.     Edward Burton is a 57 y.o. male presenting for a follow up visit.  He was last seen in  June 2019 as a new patient.  At that time, his chief complaint was chronic urticaria, but he had several other comorbidities that we did discuss.  He has a history of asthma and COPD.  His lung testing looked terrible with an FEV1 of 26%.  This did improve with the nebulizer.  He was on [facility] when I saw him, I recommended that he remain on this.  We added on Singulair 10 mg daily as well.  For his urticaria, he did had no red flags in his history.  We did obtain a limited lab work-up including a CBC, tryptase, ESR, and CRP.  These were collected earlier this week and were largely normal.  His environmental allergy panel was positive to dust mites, cockroach, Alternaria, and one other mold.  His inflammatory markers were ever so slightly elevated with an ESR of 46 and a CRP of 13.  We obtained an alpha gal panel which is still pending at the time of this writing.  Since the last visit, he has done well.  He is in quite a good mood today.  Since last visit, he did get married to his now third wife.  He has been married for 1 month now.  Asthma/Respiratory Symptom History: He is not taking the [facility] and seems confused when asked about it.  On the chart, he points to both Ventolin and Combivent as medications that he takes every day.  ACT score today is 14, indicating subpar asthma control.  He has required no prednisone or ER visits for his breathing.  We did confirm that Ruthe Mannan was on his education list, but he claims to have never seen it.  He does cough 3-4 times per week at night, but thinks this might be related to his likely sleep apnea.  He will be having a sleep study in the near future.  He does snore very loudly.  Allergic Rhinitis Symptom History: He remains on a nose spray as needed to loosen things up.  He is on the Singulair as well as loratadine.  He has not needed antibiotics since last visit.  Food Allergy Symptom History: He itches with pineapples and tomatoes. He starts itching with these.  The same thing happens with ketchup.  He does not have an epinephrine autoinjector.  He has never been tested for this.  His hives have apparently resolved.  He does use the suppressive doses of antihistamines as well as Zantac.   Otherwise, there have been no changes to his past medical history, surgical history, family history, or social history.    Review of Systems: a 14-point review of systems is pertinent for what is mentioned in HPI.  Otherwise, all other systems were negative.  Constitutional: negative other than that listed in the HPI Eyes: negative other than that listed in the HPI Ears, nose, mouth, throat, and face: negative other than that listed in the HPI Respiratory: negative other than that listed in the HPI Cardiovascular: negative other than that listed in the HPI Gastrointestinal: negative other than that listed in  the HPI Genitourinary: negative other than that listed in the HPI Integument: negative other than that listed in the HPI Hematologic: negative other than that listed in the HPI Musculoskeletal: negative other than that listed in the HPI Neurological: negative other than that listed in the HPI Allergy/Immunologic: negative other than that listed in the HPI    Objective:   Blood pressure 138/70, pulse 68, temperature 98.6 F (37 C), temperature source Oral, resp. rate 20, height 5' 9.49" (1.765 m), weight (!) 323 lb 12.8 oz (146.9 kg), SpO2 96 %. Body mass index is 47.15 kg/m.   Physical Exam:  General: Alert, interactive, in no acute distress. Boisterous obese male.  Eyes: No conjunctival injection bilaterally, no discharge on the right, no discharge on the left and no Horner-Trantas dots present. PERRL bilaterally. EOMI without pain. No photophobia.  Ears: Right TM pearly gray with normal light reflex, Left TM pearly gray with normal light reflex, Right TM intact without perforation and Left TM intact without perforation.  Nose/Throat: External  nose within normal limits and septum midline. Turbinates edematous and pale with clear discharge. Posterior oropharynx mildly erythematous without cobblestoning in the posterior oropharynx. Tonsils 2+ without exudates.  Tongue without thrush. Lungs: Clear to auscultation without wheezing, rhonchi or rales. No increased work of breathing. CV: Normal S1/S2. No murmurs. Capillary refill <2 seconds.  Skin: Warm and dry, without lesions or rashes. Neuro:   Grossly intact. No focal deficits appreciated. Responsive to questions.  Diagnostic studies:   Spirometry: results abnormal (FEV1: 0.91/33%, FVC: 1.84/48%, FEV1/FVC: 49%).    Spirometry consistent with mixed obstructive and restrictive disease.   Allergy Studies: none       Salvatore Marvel, MD  [facility] [place]

## 2017-10-24 NOTE — Telephone Encounter (Signed)
[facility] of [facility]. Per [name] will call in Symbicort 160 mcg 2 puffs BID

## 2017-10-25 LAB — CBC WITH DIFFERENTIAL/PLATELET
BASOS ABS: 0 10*3/uL (ref 0.0–0.2)
Basos: 0 %
EOS (ABSOLUTE): 0.2 10*3/uL (ref 0.0–0.4)
Eos: 3 %
HEMOGLOBIN: 11.1 g/dL — AB (ref 13.0–17.7)
Hematocrit: 34.1 % — ABNORMAL LOW (ref 37.5–51.0)
IMMATURE GRANS (ABS): 0 10*3/uL (ref 0.0–0.1)
Immature Granulocytes: 0 %
LYMPHS: 28 %
Lymphocytes Absolute: 1.7 10*3/uL (ref 0.7–3.1)
MCH: 26.9 pg (ref 26.6–33.0)
MCHC: 32.6 g/dL (ref 31.5–35.7)
MCV: 83 fL (ref 79–97)
MONOCYTES: 8 %
Monocytes Absolute: 0.5 10*3/uL (ref 0.1–0.9)
Neutrophils Absolute: 3.7 10*3/uL (ref 1.4–7.0)
Neutrophils: 61 %
Platelets: 210 10*3/uL (ref 150–450)
RBC: 4.13 x10E6/uL — AB (ref 4.14–5.80)
RDW: 13.5 % (ref 12.3–15.4)
WBC: 6.1 10*3/uL (ref 3.4–10.8)

## 2017-10-25 LAB — IGE+ALLERGENS ZONE 2(30)
Alternaria Alternata IgE: 1.07 kU/L — AB
Aspergillus Fumigatus IgE: <0.1 kU/L
Bermuda Grass IgE: <0.1 kU/L
Cat Dander IgE: <0.1 kU/L
Cedar, Mountain IgE: <0.1 kU/L
Cladosporium Herbarum IgE: <0.1 kU/L
Common Silver Birch IgE: <0.1 kU/L
D001-IGE D PTERONYSSINUS: 0.24 kU/L — AB
D002-IGE D FARINAE: 0.11 kU/L — AB
Dog Dander IgE: <0.1 kU/L
I206-IGE COCKROACH, AMERICAN: 0.21 kU/L — AB
IGE (IMMUNOGLOBULIN E), SERUM: 144 [IU]/mL (ref 6–495)
Johnson Grass IgE: <0.1 kU/L
Mugwort IgE Qn: <0.1 kU/L
Nettle IgE: <0.1 kU/L
Oak, White IgE: <0.1 kU/L
Plantain, English IgE: <0.1 kU/L
Stemphylium Herbarum IgE: 0.15 kU/L — AB
Sweet gum IgE RAST Ql: <0.1 kU/L
White Mulberry IgE: <0.1 kU/L

## 2017-10-25 LAB — ALPHA-GAL PANEL
Alpha Gal IgE*: 0.79 kU/L — ABNORMAL HIGH (ref <0.10)
BEEF (BOS SPP) IGE: 0.16 kU/L (ref <0.35)
Class Interpretation: 0
LAMB CLASS INTERPRETATION: 0
Lamb/Mutton (Ovis spp) IgE: <0.1 kU/L (ref <0.35)

## 2017-10-25 LAB — SEDIMENTATION RATE: Sed Rate: 46 mm/hr — ABNORMAL HIGH (ref 0–30)

## 2017-10-25 LAB — CHRONIC URTICARIA: cu index: <2.2 (ref <10)

## 2017-10-25 LAB — C-REACTIVE PROTEIN: CRP: 13 mg/L — AB (ref 0–10)

## 2017-10-25 LAB — TRYPTASE: TRYPTASE: 4 ug/L (ref 2.2–13.2)

## 2017-10-30 ENCOUNTER — Other Ambulatory Visit: Payer: Self-pay | Admitting: *Deleted

## 2017-10-30 MED ORDER — IPRATROPIUM-ALBUTEROL 0.5-2.5 (3) MG/3ML IN SOLN: 3.0000 mL | RESPIRATORY_TRACT | 0 refills | Status: DC | PRN

## 2017-11-03 ENCOUNTER — Telehealth: Payer: Self-pay

## 2017-11-03 NOTE — Telephone Encounter (Signed)
-----   Message from [name], MD sent at 10/31/2017  5:26 PM EDT ----- Regarding: RE: HST He has ROV coming up in couple of months.  If he shows up for that, will discuss about home sleep study.  V   ----- Message ----- From: Tana [place] Sent: 10/31/2017   5:18 PM EDT To: [name], MD, Georjean Mode, CMA Subject: HST                                            Dr. Halford Chessman I have tried to get this patient scheduled to do the HST I called 08/19/2017 and got him scheduled for 09/26/2017 and he no showed. I rescheduled him for 10/10/2017 and he no showed again. I tried to call one more time on 10/22/2017 and he has not returned my call. He does live in [place], [place] and that is a distance to come pick up the HST machine and give [place] 15 minutes just to bring it back the next morning. I don't think really really wants to get this scheduled  Thanks Rodena Piety

## 2017-11-07 ENCOUNTER — Ambulatory Visit (INDEPENDENT_AMBULATORY_CARE_PROVIDER_SITE_OTHER): Payer: PRIVATE HEALTH INSURANCE | Admitting: Primary Care

## 2017-11-07 ENCOUNTER — Encounter: Payer: Self-pay | Admitting: Primary Care

## 2017-11-07 ENCOUNTER — Ambulatory Visit (INDEPENDENT_AMBULATORY_CARE_PROVIDER_SITE_OTHER)
Admission: RE | Admit: 2017-11-07 | Discharge: 2017-11-07 | Disposition: A | Discharge status: Home / Self Care | Payer: PRIVATE HEALTH INSURANCE | Source: Ambulatory Visit | Attending: Primary Care | Admitting: Primary Care

## 2017-11-07 ENCOUNTER — Ambulatory Visit: Payer: PRIVATE HEALTH INSURANCE | Admitting: Nurse Practitioner

## 2017-11-07 VITALS — BP 122/80 | HR 85 | Temp 97.9°F | Ht 71.0 in | Wt 315.0 lb

## 2017-11-07 DIAGNOSIS — J449 Chronic obstructive pulmonary disease, unspecified: Secondary | ICD-10-CM

## 2017-11-07 DIAGNOSIS — I447 Left bundle-branch block, unspecified: Secondary | ICD-10-CM | POA: Diagnosis not present

## 2017-11-07 DIAGNOSIS — M79622 Pain in left upper arm: Secondary | ICD-10-CM | POA: Diagnosis not present

## 2017-11-07 DIAGNOSIS — R0683 Snoring: Secondary | ICD-10-CM

## 2017-11-07 DIAGNOSIS — G4733 Obstructive sleep apnea (adult) (pediatric): Secondary | ICD-10-CM

## 2017-11-07 MED ORDER — IBUPROFEN 800 MG PO TABS: 800.0000 mg | ORAL_TABLET | Freq: Three times a day (TID) | ORAL | 0 refills | Status: DC | PRN
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
## 2017-11-07 NOTE — Patient Instructions (Addendum)
CXR and left shoulder xray today  RX Ibuprofen 800mg  2-3 times a day as needed for pain (please take with food)  Gentle ROM left shoulder   Home sleep test as ordered   We will call with results on Monday (if abnormal I will call you this weekend)     Shoulder Range of Motion Exercises Shoulder range of motion (ROM) exercises are designed to keep the shoulder moving freely. They are often recommended for people who have shoulder pain. Phase 1 exercises When you are able, do this exercise 5-6 days per week, or as told by your health care provider. Work toward doing 2 sets of 10 swings. Pendulum Exercise How To Do This Exercise Lying Down 1. Lie face-down on a bed with your abdomen close to the side of the bed. 2. Let your arm hang over the side of the bed. 3. Relax your shoulder, arm, and hand. 4. Slowly and gently swing your arm forward and back. Do not use your neck muscles to swing your arm. They should be relaxed. If you are struggling to swing your arm, have someone gently swing it for you. When you do this exercise for the first time, swing your arm at a 15 degree angle for 15 seconds, or swing your arm 10 times. As pain lessens over time, increase the angle of the swing to 30-45 degrees. 5. Repeat steps 1-4 with the other arm.  How To Do This Exercise While Standing 1. Stand next to a sturdy chair or table and hold on to it with your hand. 1. Bend forward at the waist. 2. Bend your knees slightly. 3. Relax your other arm and let it hang limp. 4. Relax the shoulder blade of the arm that is hanging and let it drop. 5. While keeping your shoulder relaxed, use body motion to swing your arm in small circles. The first time you do this exercise, swing your arm for about 30 seconds or 10 times. When you do it next time, swing your arm for a little longer. 6. Stand up tall and relax. 7. Repeat steps 1-7, this time changing the direction of the circles. 2. Repeat steps 1-8 with the  other arm.  Phase 2 exercises Do these exercises 3-4 times per day on 5-6 days per week or as told by your health care provider. Work toward holding the stretch for 20 seconds. Stretching Exercise 1 1. Lift your arm straight out in front of you. 2. Bend your arm 90 degrees at the elbow (right angle) so your forearm goes across your body and looks like the letter "L." 3. Use your other arm to gently pull the elbow forward and across your body. 4. Repeat steps 1-3 with the other arm. Stretching Exercise 2 You will need a towel or rope for this exercise. 1. Bend one arm behind your back with the palm facing outward. 2. Hold a towel with your other hand. 3. Reach the arm that holds the towel above your head, and bend that arm at the elbow. Your wrist should be behind your neck. 4. Use your free hand to grab the free end of the towel. 5. With the higher hand, gently pull the towel up behind you. 6. With the lower hand, pull the towel down behind you. 7. Repeat steps 1-6 with the other arm.  Phase 3 exercises Do each of these exercises at four different times of day (sessions) every day or as told by your health care provider. To begin  with, repeat each exercise 5 times (repetitions). Work toward doing 3 sets of 12 repetitions or as told by your health care provider. Strengthening Exercise 1 You will need a light weight for this activity. As you grow stronger, you may use a heavier weight. 1. Standing with a weight in your hand, lift your arm straight out to the side until it is at the same height as your shoulder. 2. Bend your arm at 90 degrees so that your fingers are pointing to the ceiling. 3. Slowly raise your hand until your arm is straight up in the air. 4. Repeat steps 1-3 with the other arm.  Strengthening Exercise 2 You will need a light weight for this activity. As you grow stronger, you may use a heavier weight. 1. Standing with a weight in your hand, gradually move your straight  arm in an arc, starting at your side, then out in front of you, then straight up over your head. 2. Gradually move your other arm in an arc, starting at your side, then out in front of you, then straight up over your head. 3. Repeat steps 1-2 with the other arm.  Strengthening Exercise 3 You will need an elastic band for this activity. As you grow stronger, gradually increase the size of the bands or increase the number of bands that you use at one time. 1. While standing, hold an elastic band in one hand and raise that arm up in the air. 2. With your other hand, pull down the band until that hand is by your side. 3. Repeat steps 1-2 with the other arm.  This information is not intended to replace advice given to you by your health care provider. Make sure you discuss any questions you have with your health care provider. Document Released: 09/22/2002 Document Revised: 08/20/2015 Document Reviewed: 12/20/2013 Elsevier Interactive Patient Education  [employer].

## 2017-11-08 ENCOUNTER — Encounter: Payer: Self-pay | Admitting: Primary Care

## 2017-11-08 DIAGNOSIS — I447 Left bundle-branch block, unspecified: Secondary | ICD-10-CM | POA: Insufficient documentation

## 2017-11-08 DIAGNOSIS — R0683 Snoring: Secondary | ICD-10-CM | POA: Insufficient documentation

## 2017-11-08 DIAGNOSIS — M79622 Pain in left upper arm: Secondary | ICD-10-CM | POA: Insufficient documentation

## 2017-11-08 NOTE — Assessment & Plan Note (Signed)
-   Picking up HST 11/07/2017

## 2017-11-08 NOTE — Assessment & Plan Note (Signed)
-   EKG showed SR with new left bundle branch  - Has cardiologist in [place], instructed to follow up in next 1-2 weeks - Will request/review notes from cardiologist and notify them as well

## 2017-11-08 NOTE — Assessment & Plan Note (Signed)
-   Seen by Allergy and Immunology on 10/18 - Spirometry showed severe restriction and moderate obstruction - Started on Dulera BID - Continue Singulair

## 2017-11-08 NOTE — Assessment & Plan Note (Addendum)
-   Left axillary/pleuritic pain reproducible to touch (non-cardiac in nature)  - CXR and shoulder xray with no acute abnormality - Advised ibuprofen 800mg  TID (with food) as needed for pain - Gentle left shoulder ROM/stretching

## 2017-11-10 ENCOUNTER — Telehealth: Payer: Self-pay

## 2017-11-10 NOTE — Telephone Encounter (Signed)
Called dr Tommie Sams.  He is [facility]'s cardiologist at phone number [phone] and fax [phone].  Spoke with the nurse line and relayed the EKG with left bundle brach block.  Faxed a copy of the EKG to them.  The nurse said he is sporadic in his office visits.

## 2017-11-11 ENCOUNTER — Encounter: Payer: Self-pay | Admitting: Pulmonary Disease

## 2017-11-11 ENCOUNTER — Telehealth: Payer: Self-pay | Admitting: Pulmonary Disease

## 2017-11-11 ENCOUNTER — Other Ambulatory Visit: Payer: Self-pay | Admitting: *Deleted

## 2017-11-11 DIAGNOSIS — G4733 Obstructive sleep apnea (adult) (pediatric): Secondary | ICD-10-CM

## 2017-11-11 DIAGNOSIS — R0683 Snoring: Secondary | ICD-10-CM

## 2017-11-11 HISTORY — DX: Obstructive sleep apnea (adult) (pediatric): G47.33

## 2017-11-11 NOTE — Telephone Encounter (Signed)
HST 11/07/17 >> AHI 15.1, SpO2 low 80%.   Please let him know that the sleep study showed moderate obstructive sleep apnea.  Please arrange for ROV with a nurse practitioner to review treatment options.

## 2017-11-13 NOTE — Telephone Encounter (Signed)
Called and spoke with patient regarding results.  Informed the patient of results and recommendations today. Scheduled appt with EW for 11/19/17 at 9am Pt verbalized understanding and denied any questions or concerns at this time.  Nothing further needed.

## 2017-11-18 ENCOUNTER — Encounter: Payer: Self-pay | Admitting: Primary Care

## 2017-11-18 ENCOUNTER — Other Ambulatory Visit (INDEPENDENT_AMBULATORY_CARE_PROVIDER_SITE_OTHER): Payer: PRIVATE HEALTH INSURANCE

## 2017-11-18 ENCOUNTER — Ambulatory Visit (INDEPENDENT_AMBULATORY_CARE_PROVIDER_SITE_OTHER): Payer: PRIVATE HEALTH INSURANCE | Admitting: Primary Care

## 2017-11-18 VITALS — BP 132/76 | HR 74 | Ht 71.0 in | Wt 325.0 lb

## 2017-11-18 DIAGNOSIS — M79622 Pain in left upper arm: Secondary | ICD-10-CM

## 2017-11-18 DIAGNOSIS — I447 Left bundle-branch block, unspecified: Secondary | ICD-10-CM

## 2017-11-18 DIAGNOSIS — J449 Chronic obstructive pulmonary disease, unspecified: Secondary | ICD-10-CM

## 2017-11-18 DIAGNOSIS — G4733 Obstructive sleep apnea (adult) (pediatric): Secondary | ICD-10-CM | POA: Diagnosis not present

## 2017-11-18 LAB — CBC
HCT: 37.2 % — ABNORMAL LOW (ref 39.0–52.0)
HEMOGLOBIN: 12 g/dL — AB (ref 13.0–17.0)
MCHC: 32.2 g/dL (ref 30.0–36.0)
MCV: 85.4 fl (ref 78.0–100.0)
PLATELETS: 197 10*3/uL (ref 150.0–400.0)
RBC: 4.36 Mil/uL (ref 4.22–5.81)
RDW: 14.2 % (ref 11.5–15.5)
WBC: 7.4 10*3/uL (ref 4.0–10.5)

## 2017-11-18 NOTE — Patient Instructions (Addendum)
Referrral: - [facility] [place] for new CPAP start, auto titrate 5-15cm H20 with heated humidity, mask of choice and supplies. Download in 1 month. FU in 31-90 days   Sleep apnea: - Goal wear CPAP every night for 4-6 hrs or more  - Do not drive if experiencing excessive daytime fatigue or somnolence. Do not take sedation medication or drink alcohol prior to bedtime   COPD: - Change inhaler d/t insurance and patient assistance  - Stop Dulera. Restart Symbicort, take 2 puffs twice a day (this is your maintenance inhaler take everyday no matter how you feel)   - Stop Ventolin/Albuterol rescue inhaler. Use Combivent rescue inhaler, take 2 puffs every 4-6 hours as needed for shortness of breath or wheezing   - Continue Singulair at bedtime   Test results: Home sleep test- moderate sleep apnea   Chest Xray- The lungs are well aerated bilaterally. No focal infiltrate or sizable effusion is seen. Old rib fractures are noted on the left.  Left shoulder xray- No acute fracture or dislocation is noted. No soft tissue abnormality is seen. Old rib fractures are noted on the left  EKG- Showed new left bundle branch block  Recommended follow-up: - Pulmonary in 31-90 days after CPAP start with Dr. Halford Chessman or NP  - Primary care regarding left side pain- ? Physical therapy or ortho referral  - Cardiology regarding new left bundle branch block
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
## 2017-11-19 ENCOUNTER — Ambulatory Visit: Payer: PRIVATE HEALTH INSURANCE | Admitting: Primary Care

## 2017-11-19 LAB — BASIC METABOLIC PANEL
BUN: 14 mg/dL (ref 6–23)
CHLORIDE: 103 meq/L (ref 96–112)
CO2: 30 mEq/L (ref 19–32)
Calcium: 8.8 mg/dL (ref 8.4–10.5)
Creatinine, Ser: 0.94 mg/dL (ref 0.40–1.50)
GFR: 106.56 mL/min (ref >60.00)
Glucose, Bld: 136 mg/dL — ABNORMAL HIGH (ref 70–99)
Potassium: 3.8 mEq/L (ref 3.5–5.1)
Sodium: 139 mEq/L (ref 135–145)

## 2017-11-19 LAB — BRAIN NATRIURETIC PEPTIDE: PRO B NATRI PEPTIDE: 29 pg/mL (ref 0.0–100.0)

## 2017-11-19 LAB — D-DIMER, QUANTITATIVE: D-Dimer, Quant: 0.37 mcg/mL FEU (ref <0.50)

## 2017-11-20 ENCOUNTER — Telehealth: Payer: Self-pay | Admitting: Primary Care

## 2017-11-20 ENCOUNTER — Encounter: Payer: Self-pay | Admitting: Primary Care

## 2017-11-20 NOTE — Assessment & Plan Note (Signed)
-   EKG 11/07/17 - Showed new left bundle branch block - Contacted patients cardiologist and sent copy of EKG results. Encouraged patient to follow up with cardiology

## 2017-11-20 NOTE — Assessment & Plan Note (Addendum)
-   Change inhaler d/t insurance and patient assistance program - Stop Dulera. Restart Symbicort, take 2 puffs twice a day  - Stop Ventolin hfa. Use Combivent rescue inhaler, take 2 puffs every 4-6 hours as needed for shortness of breath or wheezing  - Continue Singulair at bedtime

## 2017-11-20 NOTE — Assessment & Plan Note (Signed)
-   Pain is reproducible, consistent with muscular skeletal pain - Left shoulder xray 11/07/17 - No acute fracture or dislocation is noted. No soft tissue abnormality is seen. Old rib fractures are noted on the left - Refer back to primary care for chronic management, consider PT and/or ortho consult

## 2017-11-20 NOTE — Progress Notes (Signed)
Reviewed and agree with assessment/plan.   Dylin Ihnen, MD  Pulmonary/Critical Care 01/03/2016, 12:24 PM Pager:  [phone]  

## 2017-11-20 NOTE — Assessment & Plan Note (Addendum)
-   HST 11/07/17 showed moderate sleep apnea, AHI 15.1, SpO2 low 80% - New CPAP start, auto titrate 5-15cm H20 - Goal wear CPAP every night for 4-6 hrs or more  - Do not drive if experiencing excessive daytime fatigue or somnolence. Do not take sedation medication or drink alcohol prior to bedtime

## 2017-11-20 NOTE — Telephone Encounter (Signed)
Called to speak with patient, a child answered and then the phone hung up, attempted to call back and the call went straight to voicemail. Will attempt to call back.

## 2017-11-20 NOTE — Telephone Encounter (Signed)
Notes recorded by Martyn Ehrich, NP on 11/19/2017 at 11:29 AM EST Labs looked good. Hgb has improved. BNP was normal. WBC count is normal. ------  Notes recorded by Martyn Ehrich, NP on 11/19/2017 at 8:48 AM EST D-dimer was normal  Called and spoke with pt letting him know the results of his labwork. Pt expressed understanding. Nothing further needed.

## 2017-11-20 NOTE — Telephone Encounter (Signed)
Pt is calling back [phone]

## 2017-11-21 ENCOUNTER — Ambulatory Visit: Payer: PRIVATE HEALTH INSURANCE | Admitting: Allergy & Immunology

## 2017-11-24 DIAGNOSIS — J309 Allergic rhinitis, unspecified: Secondary | ICD-10-CM

## 2018-02-04 ENCOUNTER — Other Ambulatory Visit: Payer: Self-pay | Admitting: Family Medicine

## 2018-03-04 ENCOUNTER — Telehealth: Payer: Self-pay

## 2018-03-04 NOTE — Telephone Encounter (Signed)
Prior authorization requested for Dulera 200. Upon reviewing chart patient's pulmonologist has changed patient back to Symbicort 160.

## 2018-03-12 ENCOUNTER — Other Ambulatory Visit: Payer: Self-pay | Admitting: Family Medicine

## 2018-03-12 NOTE — Telephone Encounter (Signed)
Copied from [place] [id number]. Topic: Quick Communication - Rx Refill/Question >> Mar 12, 2018  4:08 PM Reyne Dumas L wrote: Medication: gabapentin (NEURONTIN) 300 MG capsule  Has the patient contacted their pharmacy? Yes - was told to contact PCP (Agent: If no, request that the patient contact the pharmacy for the refill.) (Agent: If yes, when and what did the pharmacy advise?)  Preferred Pharmacy (with phone number or street name): [id number] - [place], [address] [phone] (Phone) [phone] (Fax)  Agent: Please be advised that RX refills may take up to 3 business days. We ask that you follow-up with your pharmacy.

## 2018-03-13 ENCOUNTER — Other Ambulatory Visit: Payer: Self-pay | Admitting: *Deleted

## 2018-03-13 ENCOUNTER — Other Ambulatory Visit: Payer: Self-pay

## 2018-03-13 ENCOUNTER — Telehealth: Payer: Self-pay | Admitting: Family Medicine

## 2018-03-13 MED ORDER — GABAPENTIN 300 MG PO CAPS: 300.0000 mg | ORAL_CAPSULE | Freq: Two times a day (BID) | ORAL | 0 refills | Status: DC

## 2018-03-13 NOTE — Telephone Encounter (Signed)
Requested Prescriptions  Pending Prescriptions Disp Refills  . gabapentin (NEURONTIN) 300 MG capsule 180 capsule 0    Sig: Take 1 capsule (300 mg total) by mouth 2 (two) times daily.     There is no refill protocol information for this order    Signed Prescriptions Disp Refills   gabapentin (NEURONTIN) 300 MG capsule 180 capsule 0    Sig: Take 1 capsule (300 mg total) by mouth 2 (two) times daily.     There is no refill protocol information for this order     gabapentin (NEURONTIN) 300 MG capsule 180 capsule 0    Sig: Take 1 capsule (300 mg total) by mouth 2 (two) times daily.     There is no refill protocol information for this order     Attempted to send request for gabapentin electronically  X 3; defaults to print; spoke with [facility] and the prescription did not print at that location; will route to office for final disposition.

## 2018-03-13 NOTE — Progress Notes (Signed)
Attempted to send request for gabapentin electronically  X 3; defaults to print; spoke with [facility] and the prescription did not print at that location; will route to office for final disposition.  Requested Prescriptions  Pending Prescriptions Disp Refills  . gabapentin (NEURONTIN) 300 MG capsule 180 capsule 0    Sig: Take 1 capsule (300 mg total) by mouth 2 (two) times daily.     There is no refill protocol information for this order    Signed Prescriptions Disp Refills   gabapentin (NEURONTIN) 300 MG capsule 180 capsule 0    Sig: Take 1 capsule (300 mg total) by mouth 2 (two) times daily.     There is no refill protocol information for this order     gabapentin (NEURONTIN) 300 MG capsule 180 capsule 0    Sig: Take 1 capsule (300 mg total) by mouth 2 (two) times daily.     There is no refill protocol information for this order

## 2018-03-13 NOTE — Telephone Encounter (Signed)
I have faxed the Rx to patient's pharmacy.

## 2018-03-13 NOTE — Telephone Encounter (Signed)
See note

## 2018-03-13 NOTE — Addendum Note (Signed)
Addended by: Addison Naegeli on: 03/13/2018 07:12 AM   Modules accepted: Orders

## 2018-03-13 NOTE — Telephone Encounter (Signed)
Requested Prescriptions  Pending Prescriptions Disp Refills  . gabapentin (NEURONTIN) 300 MG capsule 180 capsule 0    Sig: Take 1 capsule (300 mg total) by mouth 2 (two) times daily.     Neurology: Anticonvulsants - gabapentin Passed - 03/12/2018  5:07 PM      Passed - Valid encounter within last 12 months    Recent Outpatient Visits          8 months ago Cough   [facility] Parker, Algis Greenhouse, MD   9 months ago Chronic obstructive pulmonary disease, unspecified COPD type [facility])   [facility] Roni Bread, Algis Greenhouse, MD   11 months ago COPD, severe [facility])   [facility] [address] Algis Greenhouse, MD

## 2018-03-25 ENCOUNTER — Ambulatory Visit: Payer: Self-pay | Admitting: *Deleted

## 2018-03-25 NOTE — Telephone Encounter (Signed)
Pt reports "Stuffy nose and sore throat" x 1 week.  States productive cough, clear phlegm onset today. Audible wheezing during call, inspiratory and expiratory. States SOB with exertion. H/O COPD, states usually SOB but "Worse than usual." "Have to stop and catch my breath." H/O CHF. Continuous cough during call. States has used inhalers, minimal effectiveness. Used nebs treatment yesterday, "Helped a little," did not use today. States afebrile. Reports chest tightness. Also reports sore throat, unsure if red or if patches present. Pt directed to ED/UC. Pt put wife on call. States "They don't do anything for him there. They always see him at office and give him steroids and antibiotics." Reiterated need for at least UC visit, verbalizes understanding but would like Dr.Parker's input. Made aware practice busy with patients. Instructed to follow ED disposition if symptoms worsen, verbalizes understanding. Please advise: [phone]  Reason for Disposition . Wheezing is present  Answer Assessment - Initial Assessment Questions 1. ONSET: "When did the cough begin?"      1 week ago 2. SEVERITY: "How bad is the cough today?"      severe 3. RESPIRATORY DISTRESS: "Describe your breathing."      SOB, with exertion 4. FEVER: "Do you have a fever?" If so, ask: "What is your temperature, how was it measured, and when did it start?"     no 5. SPUTUM: "Describe the color of your sputum" (clear, white, yellow, green)     clear 6. HEMOPTYSIS: "Are you coughing up any blood?" If so ask: "How much?" (flecks, streaks, tablespoons, etc.)     no 7. CARDIAC HISTORY: "Do you have any history of heart disease?" (e.g., heart attack, congestive heart failure)      CHF 8. LUNG HISTORY: "Do you have any history of lung disease?"  (e.g., pulmonary embolus, asthma, emphysema)     COPD 9. PE RISK FACTORS: "Do you have a history of blood clots?" (or: recent major surgery, recent prolonged travel, bedridden)     no 10.  OTHER SYMPTOMS: "Do you have any other symptoms?" (e.g., runny nose, wheezing, chest pain)       wheezing  12. TRAVEL: "Have you traveled out of the country in the last month?" (e.g., travel history, exposures) no  Protocols used: [place]

## 2018-03-25 NOTE — Telephone Encounter (Signed)
See note

## 2018-03-26 NOTE — Telephone Encounter (Signed)
Agree with ED dispo if having chest tightness and worsening shortness of breath.  Algis Greenhouse. Jerline Pain, MD 03/26/2018 1:51 PM

## 2018-03-26 NOTE — Telephone Encounter (Signed)
Left voice message for patient to call clinic. CRM Created.  

## 2018-03-27 NOTE — Telephone Encounter (Signed)
Spoke with patient they will like to schedule appt to be seen today.Aware no appt available.Voices understanding they will try another clinic.Aware Dr.Parker recommend ED if chest tightness and SOB worsening.

## 2018-03-29 DIAGNOSIS — I509 Heart failure, unspecified: Secondary | ICD-10-CM | POA: Diagnosis not present

## 2018-03-29 DIAGNOSIS — J449 Chronic obstructive pulmonary disease, unspecified: Secondary | ICD-10-CM | POA: Diagnosis not present

## 2018-03-29 DIAGNOSIS — I110 Hypertensive heart disease with heart failure: Secondary | ICD-10-CM | POA: Diagnosis not present

## 2018-04-03 ENCOUNTER — Ambulatory Visit (INDEPENDENT_AMBULATORY_CARE_PROVIDER_SITE_OTHER): Payer: BLUE CROSS/BLUE SHIELD | Admitting: Family Medicine

## 2018-04-03 DIAGNOSIS — R059 Cough, unspecified: Secondary | ICD-10-CM

## 2018-04-03 DIAGNOSIS — I5020 Unspecified systolic (congestive) heart failure: Secondary | ICD-10-CM | POA: Diagnosis not present

## 2018-04-03 DIAGNOSIS — R05 Cough: Secondary | ICD-10-CM | POA: Diagnosis not present

## 2018-04-03 DIAGNOSIS — J449 Chronic obstructive pulmonary disease, unspecified: Secondary | ICD-10-CM

## 2018-04-03 DIAGNOSIS — J309 Allergic rhinitis, unspecified: Secondary | ICD-10-CM

## 2018-04-03 MED ORDER — DOXYCYCLINE HYCLATE 100 MG PO TABS: 100.0000 mg | ORAL_TABLET | Freq: Two times a day (BID) | ORAL | 0 refills | Status: DC

## 2018-04-03 MED ORDER — IPRATROPIUM-ALBUTEROL 20-100 MCG/ACT IN AERS: 1.0000 | INHALATION_SPRAY | Freq: Four times a day (QID) | RESPIRATORY_TRACT | 5 refills | Status: DC | PRN

## 2018-04-03 MED ORDER — PREDNISONE 50 MG PO TABS: ORAL_TABLET | ORAL | 0 refills | Status: DC

## 2018-04-03 MED ORDER — BUDESONIDE-FORMOTEROL FUMARATE 160-4.5 MCG/ACT IN AERO: 2.0000 | INHALATION_SPRAY | Freq: Two times a day (BID) | RESPIRATORY_TRACT | 5 refills | Status: DC

## 2018-04-03 MED ORDER — IPRATROPIUM BROMIDE 0.06 % NA SOLN: 2.0000 | Freq: Four times a day (QID) | NASAL | 5 refills | Status: DC

## 2018-04-03 MED ORDER — ALBUTEROL SULFATE (2.5 MG/3ML) 0.083% IN NEBU: 2.5000 mg | INHALATION_SOLUTION | Freq: Four times a day (QID) | RESPIRATORY_TRACT | 1 refills | Status: DC | PRN

## 2018-04-03 MED ORDER — LORATADINE 10 MG PO TABS: 10.0000 mg | ORAL_TABLET | Freq: Every day | ORAL | 3 refills | Status: DC

## 2018-04-03 NOTE — Assessment & Plan Note (Signed)
Possibly contributing to his cough.  Sent in Atrovent nasal spray.  Will have some improvement with prednisone hopefully.  Continue Singulair 10 mg daily and cetirizine 10 mg daily.

## 2018-04-03 NOTE — Progress Notes (Signed)
    Chief Complaint:  Edward Burton is a 58 y.o. male who presents today for a virtual office visit with a chief complaint of cough.   Assessment/Plan:  COPD (chronic obstructive pulmonary disease) ([place]) History consistent with mild flare.  No signs of respiratory distress.  No red flags.  Will start doxycycline 100 mg twice daily x7 days.  Start prednisone burst.  Will refill his Combivent and Symbicort inhalers.  Also refilled albuterol for his nebulizer.  Allergic rhinitis Possibly contributing to his cough.  Sent in Atrovent nasal spray.  Will have some improvement with prednisone hopefully.  Continue Singulair 10 mg daily and cetirizine 10 mg daily.  HFrEF (heart failure with reduced ejection fraction) ([place]) Continue management per cardiology.     Subjective:  HPI:  Cough Symptoms started a few weeks ago.  Associated with runny nose, sore throat, and malaise.  Went to the emergency room last week and was diagnosed with bronchitis.  Was started on prednisone and azithromycin.  Has not noticed any significant improvement in symptoms since then.  No wheeze.  No fevers or chills.  No other obvious alleviating or aggravating factors.  His stable, chronic medical conditions are outlined below:  COPD - On symbicort 2 puffs twice daily and combivent as needed.  Uses albuterol as needed as well.  Allergic Rhinitis  - On cetirizine 10 mg daily and singulair 10mg  daily  % HFrEF -Follows with cardiology -On lisinopril 10 mg daily, spironolactone 25 mg daily, Lasix 40 mg daily.  ROS: Per HPI  PMH: He reports that he has quit smoking. His smoking use included cigarettes. He has never used smokeless tobacco. He reports that he does not drink alcohol or use drugs.      Objective/Observations  Physical Exam: Gen: NAD, resting comfortably Pulm: Normal work of breathing, speaking in full sentences. Neuro: Grossly normal, moves all extremities Psych: Normal affect and thought content   Virtual Visit via Video   I connected with Edward Burton on 04/03/18 at  4:20 PM EDT by a video enabled telemedicine application and verified that I am speaking with the correct person using two identifiers. I discussed the limitations of evaluation and management by telemedicine and the availability of in person appointments. The patient expressed understanding and agreed to proceed.   Patient location: Home Provider location: [place] participating in the virtual visit: Myself and Patient     Algis Greenhouse. Jerline Pain, MD 04/03/2018 4:55 PM

## 2018-04-03 NOTE — Assessment & Plan Note (Signed)
History consistent with mild flare.  No signs of respiratory distress.  No red flags.  Will start doxycycline 100 mg twice daily x7 days.  Start prednisone burst.  Will refill his Combivent and Symbicort inhalers.  Also refilled albuterol for his nebulizer.

## 2018-04-03 NOTE — Assessment & Plan Note (Signed)
Continue management per cardiology. 

## 2018-04-06 DIAGNOSIS — I517 Cardiomegaly: Secondary | ICD-10-CM | POA: Diagnosis not present

## 2018-04-06 DIAGNOSIS — I422 Other hypertrophic cardiomyopathy: Secondary | ICD-10-CM | POA: Diagnosis not present

## 2018-04-06 DIAGNOSIS — I519 Heart disease, unspecified: Secondary | ICD-10-CM | POA: Diagnosis not present

## 2018-05-08 ENCOUNTER — Ambulatory Visit: Payer: Self-pay

## 2018-05-08 ENCOUNTER — Encounter: Payer: Self-pay | Admitting: Family Medicine

## 2018-05-08 ENCOUNTER — Ambulatory Visit (INDEPENDENT_AMBULATORY_CARE_PROVIDER_SITE_OTHER): Payer: BLUE CROSS/BLUE SHIELD | Admitting: Family Medicine

## 2018-05-08 DIAGNOSIS — J329 Chronic sinusitis, unspecified: Secondary | ICD-10-CM | POA: Diagnosis not present

## 2018-05-08 MED ORDER — AMOXICILLIN-POT CLAVULANATE 875-125 MG PO TABS: 1.0000 | ORAL_TABLET | Freq: Two times a day (BID) | ORAL | 0 refills | Status: DC

## 2018-05-08 MED ORDER — PREDNISONE 50 MG PO TABS: ORAL_TABLET | ORAL | 0 refills | Status: DC

## 2018-05-08 NOTE — Telephone Encounter (Signed)
Incoming call from Patient who has a complaint of dizziness.  Denies vertogo.  . Rates the dizzines as moderate.  Was last week.  Reports pressure around the eyes.  that radiate to  Back of neck.  Reports that upon standinding up makes him dizzy.  Blood pressure is 173 /88  Heart rate 87. Denies diarrhea.  Reviewed protocol and provided care advice.  Voiced understanding.  Transferred Patient to [place] to schedule appointment.    Reason for Disposition . [1] MODERATE dizziness (e.g., interferes with normal activities) AND [2] has NOT been evaluated by physician for this  (Exception: dizziness caused by heat exposure, sudden standing, or poor fluid intake)  Answer Assessment - Initial Assessment Questions 1. DESCRIPTION: "Describe your dizziness."  eye hurt sinuses hurt, back of head  2. LIGHTHEADED: "Do you feel lightheaded?" (e.g., somewhat faint, woozy, weak upon standing)     *No Answer* 3. VERTIGO: "Do you feel like either you or the room is spinning or tilting?" (i.e. vertigo)     denies 4. SEVERITY: "How bad is it?"  "Do you feel like you are going to faint?" "Can you stand and walk?"   - MILD - walking normally   - MODERATE - interferes with normal activities (e.g., work, school)    - SEVERE - unable to stand, requires support to walk, feels like passing out now. Moderate    Pressure around eye goes to back of neck 5. ONSET:  "When did the dizziness begin?"     *started last week 6. AGGRAVATING FACTORS: "Does anything make it worse?" (e.g., standing, change in head position)    Standing up   7. HEART RATE: "Can you tell me your heart rate?" "How many beats in 15 seconds?"  (Note: not all patients can do this)       187/88 heart rate 87 8. CAUSE: "What do you think is causing the dizziness?"     *No Answer* 9. RECURRENT SYMPTOM: "Have you had dizziness before?" If so, ask: "When was the last time?" "What happened that time?"      10. OTHER SYMPTOMS: "Do you have any  other symptoms?" (e.g., fever, chest pain, vomiting, diarrhea, bleeding)       Diarrhea  Temperature.  97.0 11. PREGNANCY: "Is there any chance you are pregnant?" "When was your last menstrual period?"       na  Protocols used: DIZZINESS [facility]

## 2018-05-08 NOTE — Progress Notes (Signed)
    Chief Complaint:  Edward Burton is a 58 y.o. male who presents today for a virtual office visit with a chief complaint of sinus congestion.   Assessment/Plan:  Sinusitis No red flags. Will start prednisone and Augmentin.  Encouraged good oral hydration.  Discussed reasons to return to care.  Follow-up as needed.    Subjective:  HPI:  Sinus Congestion Started about a week ago.  Feels like prior sinus infections.  He has had associated some dizziness and congestion for the past week as well.  Symptoms seem to be worse when standing up.  No fevers or chills. No chest pain or shortness of breath. No weakness or numbness. Tried sinus medications which have not seemed to help. No ear pain or pressure. No hearing loss. No nausea or vomiting.  No other obvious alleviating or aggravating factors.  ROS: Per HPI  PMH: He reports that he has quit smoking. His smoking use included cigarettes. He has never used smokeless tobacco. He reports that he does not drink alcohol or use drugs.      Objective/Observations  Physical Exam: Gen: NAD, resting comfortably Pulm: Normal work of breathing Neuro: Grossly normal, moves all extremities. Cranial nerves III through XII grossly intact. Psych: Normal affect and thought content.    Virtual Visit via Video   I connected with Edward Burton on 05/08/18 at  3:40 PM EDT by a video enabled telemedicine application and verified that I am speaking with the correct person using two identifiers. I discussed the limitations of evaluation and management by telemedicine and the availability of in person appointments. The patient expressed understanding and agreed to proceed.   Patient location: Home Provider location: [place] participating in the virtual visit: Myself and Patient     Edward Burton. Jerline Pain, MD 05/08/2018 4:45 PM

## 2018-05-08 NOTE — Telephone Encounter (Signed)
Patient had virtual appointment this afternoon.

## 2018-05-08 NOTE — Telephone Encounter (Signed)
See triage note.

## 2018-05-16 ENCOUNTER — Emergency Department (HOSPITAL_COMMUNITY): Payer: BLUE CROSS/BLUE SHIELD

## 2018-05-16 ENCOUNTER — Other Ambulatory Visit: Payer: Self-pay

## 2018-05-16 ENCOUNTER — Emergency Department (HOSPITAL_COMMUNITY)
Admission: EM | Admit: 2018-05-16 | Discharge: 2018-05-16 | Disposition: A | Discharge status: Home / Self Care | Payer: BLUE CROSS/BLUE SHIELD | Attending: Emergency Medicine | Admitting: Emergency Medicine

## 2018-05-16 ENCOUNTER — Encounter (HOSPITAL_COMMUNITY): Payer: Self-pay | Admitting: Emergency Medicine

## 2018-05-16 DIAGNOSIS — J45909 Unspecified asthma, uncomplicated: Secondary | ICD-10-CM | POA: Diagnosis not present

## 2018-05-16 DIAGNOSIS — Z03818 Encounter for observation for suspected exposure to other biological agents ruled out: Secondary | ICD-10-CM | POA: Diagnosis not present

## 2018-05-16 DIAGNOSIS — J449 Chronic obstructive pulmonary disease, unspecified: Secondary | ICD-10-CM | POA: Diagnosis not present

## 2018-05-16 DIAGNOSIS — J019 Acute sinusitis, unspecified: Secondary | ICD-10-CM

## 2018-05-16 DIAGNOSIS — T380X5A Adverse effect of glucocorticoids and synthetic analogues, initial encounter: Secondary | ICD-10-CM | POA: Diagnosis not present

## 2018-05-16 DIAGNOSIS — R739 Hyperglycemia, unspecified: Secondary | ICD-10-CM

## 2018-05-16 DIAGNOSIS — Z87891 Personal history of nicotine dependence: Secondary | ICD-10-CM | POA: Diagnosis not present

## 2018-05-16 DIAGNOSIS — Z20828 Contact with and (suspected) exposure to other viral communicable diseases: Secondary | ICD-10-CM | POA: Diagnosis not present

## 2018-05-16 DIAGNOSIS — I10 Essential (primary) hypertension: Secondary | ICD-10-CM | POA: Diagnosis not present

## 2018-05-16 DIAGNOSIS — R05 Cough: Secondary | ICD-10-CM | POA: Diagnosis not present

## 2018-05-16 DIAGNOSIS — E0965 Drug or chemical induced diabetes mellitus with hyperglycemia: Secondary | ICD-10-CM | POA: Diagnosis not present

## 2018-05-16 DIAGNOSIS — R0602 Shortness of breath: Secondary | ICD-10-CM | POA: Diagnosis not present

## 2018-05-16 LAB — BASIC METABOLIC PANEL
Anion gap: 9 (ref 5–15)
BUN: 10 mg/dL (ref 6–20)
CO2: 25 mmol/L (ref 22–32)
Calcium: 8.8 mg/dL — ABNORMAL LOW (ref 8.9–10.3)
Chloride: 102 mmol/L (ref 98–111)
Creatinine, Ser: 0.83 mg/dL (ref 0.61–1.24)
GFR calc Af Amer: >60 mL/min (ref >60)
GFR calc non Af Amer: >60 mL/min (ref >60)
Glucose, Bld: 273 mg/dL — ABNORMAL HIGH (ref 70–99)
Potassium: 3.7 mmol/L (ref 3.5–5.1)
Sodium: 136 mmol/L (ref 135–145)

## 2018-05-16 LAB — CBC WITH DIFFERENTIAL/PLATELET
Abs Immature Granulocytes: 0.03 10*3/uL (ref 0.00–0.07)
Basophils Absolute: 0 10*3/uL (ref 0.0–0.1)
Basophils Relative: 0 %
Eosinophils Absolute: 0.1 10*3/uL (ref 0.0–0.5)
Eosinophils Relative: 2 %
HCT: 43.2 % (ref 39.0–52.0)
Hemoglobin: 13.7 g/dL (ref 13.0–17.0)
Immature Granulocytes: 0 %
Lymphocytes Relative: 27 %
Lymphs Abs: 2.4 10*3/uL (ref 0.7–4.0)
MCH: 28 pg (ref 26.0–34.0)
MCHC: 31.7 g/dL (ref 30.0–36.0)
MCV: 88.3 fL (ref 80.0–100.0)
Monocytes Absolute: 0.6 10*3/uL (ref 0.1–1.0)
Monocytes Relative: 7 %
Neutro Abs: 5.6 10*3/uL (ref 1.7–7.7)
Neutrophils Relative %: 64 %
Platelets: 223 10*3/uL (ref 150–400)
RBC: 4.89 MIL/uL (ref 4.22–5.81)
RDW: 14.9 % (ref 11.5–15.5)
WBC: 8.7 10*3/uL (ref 4.0–10.5)
nRBC: 0 % (ref 0.0–0.2)

## 2018-05-16 LAB — SARS CORONAVIRUS 2 BY RT PCR (HOSPITAL ORDER, PERFORMED IN ~~LOC~~ HOSPITAL LAB): SARS Coronavirus 2: NEGATIVE

## 2018-05-16 LAB — GROUP A STREP BY PCR: Group A Strep by PCR: NOT DETECTED

## 2018-05-16 LAB — TROPONIN I: Troponin I: <0.03 ng/mL (ref <0.03)

## 2018-05-16 LAB — BRAIN NATRIURETIC PEPTIDE: B Natriuretic Peptide: 18 pg/mL (ref 0.0–100.0)

## 2018-05-16 MED ORDER — SODIUM CHLORIDE 0.9 % IV BOLUS
1000.0000 mL | Freq: Once | INTRAVENOUS | Status: AC
  Administered 2018-05-16: 16:00:00 1000 mL via INTRAVENOUS

## 2018-05-16 MED ORDER — ALBUTEROL SULFATE HFA 108 (90 BASE) MCG/ACT IN AERS
4.0000 | INHALATION_SPRAY | Freq: Once | RESPIRATORY_TRACT | Status: AC
  Administered 2018-05-16: 17:00:00 4 via RESPIRATORY_TRACT
  Filled 2018-05-16: qty 6.7

## 2018-05-16 MED ORDER — OXYMETAZOLINE HCL 0.05 % NA SOLN
1.0000 | Freq: Once | NASAL | Status: AC
  Administered 2018-05-16: 1 via NASAL
  Filled 2018-05-16: qty 30

## 2018-05-16 MED ORDER — AEROCHAMBER Z-STAT PLUS/MEDIUM MISC
1.0000 | Freq: Once | Status: AC
  Administered 2018-05-16: 18:00:00 1

## 2018-05-16 MED ORDER — SODIUM CHLORIDE-SODIUM BICARB 2300-700 MG NA KIT: 1.0000 | PACK | Freq: Two times a day (BID) | NASAL | 0 refills | Status: AC

## 2018-05-16 MED ORDER — FLUTICASONE PROPIONATE 50 MCG/ACT NA SUSP: 2.0000 | Freq: Every day | NASAL | 0 refills | Status: DC
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
## 2018-05-16 NOTE — ED Triage Notes (Signed)
Pt c/o productive cough, sore throat, sinus pain and pressure, eye pain/redness, & HA x 1 month and has been on 3 different antibiotics with no relief. Also endorses intermittent dizziness. Denies fever/SOB.

## 2018-05-30 ENCOUNTER — Other Ambulatory Visit: Payer: Self-pay | Admitting: Family Medicine

## 2018-06-04 ENCOUNTER — Other Ambulatory Visit: Payer: Self-pay | Admitting: Family Medicine

## 2018-06-08 ENCOUNTER — Telehealth: Payer: Self-pay | Admitting: Family Medicine

## 2018-06-08 NOTE — Telephone Encounter (Signed)
See note  Copied from [place] [id number]. Topic: General - Other >> Jun 08, 2018  3:03 PM Wynetta Emery, [place] C wrote: Reason for CRM: pt and spouse called in to be advised. Pt was seen by the ED and was prescribed Metformin, (unsure of the strength) pt is requesting a refill on his medication.    Pharmacy: [id number] [place], [place] [phone] (Phone) [phone] (Fax)

## 2018-06-08 NOTE — Telephone Encounter (Signed)
Not able to leave message,voicemail full. Please schedule patient in office visit.

## 2018-06-23 ENCOUNTER — Encounter: Payer: Self-pay | Admitting: Family Medicine

## 2018-06-23 ENCOUNTER — Telehealth: Payer: Self-pay | Admitting: Physical Therapy

## 2018-06-23 ENCOUNTER — Ambulatory Visit (INDEPENDENT_AMBULATORY_CARE_PROVIDER_SITE_OTHER): Payer: BLUE CROSS/BLUE SHIELD | Admitting: Family Medicine

## 2018-06-23 DIAGNOSIS — E119 Type 2 diabetes mellitus without complications: Secondary | ICD-10-CM | POA: Insufficient documentation

## 2018-06-23 DIAGNOSIS — R739 Hyperglycemia, unspecified: Secondary | ICD-10-CM

## 2018-06-23 DIAGNOSIS — J329 Chronic sinusitis, unspecified: Secondary | ICD-10-CM

## 2018-06-23 MED ORDER — METFORMIN HCL ER 750 MG PO TB24: 750.0000 mg | ORAL_TABLET | Freq: Every day | ORAL | 3 refills | Status: DC

## 2018-06-23 MED ORDER — LEVOFLOXACIN 750 MG PO TABS: 750.0000 mg | ORAL_TABLET | Freq: Every day | ORAL | 0 refills | Status: DC

## 2018-06-23 MED ORDER — PREDNISONE 50 MG PO TABS: ORAL_TABLET | ORAL | 0 refills | Status: DC

## 2018-06-23 NOTE — Assessment & Plan Note (Signed)
Possibly elevated in setting of prednisone use.  Does not currently have any symptoms.  Will restart metformin 750 mg once daily.  He will follow-up here soon for A1c.

## 2018-06-23 NOTE — Progress Notes (Signed)
    Chief Complaint:  Edward Burton is a 58 y.o. male who presents for a telephone visit with a chief complaint of sinus congestion.   Assessment/Plan:  Sinusitis No improvement with Augmentin and prednisone.  This was several weeks ago.  Will try another round of antibiotics with Levaquin and prednisone.  He will continue taking Flonase.  If no improvement, will need referral to ENT.  Hyperglycemia Possibly elevated in setting of prednisone use.  Does not currently have any symptoms.  Will restart metformin 750 mg once daily.  He will follow-up here soon for A1c.     Subjective:  HPI:  Sinus Congestion Symptoms started several weeks ago.  He was seen in the ED couple weeks ago and given Flonase and a prescription for a Nettie pot.  He was also treated with a course of Augmentin.  Symptoms did not improve.  He is continued to have sinus congestion and rhinorrhea.  Is very difficult for him to breathe through his nose.  No fevers.  Is had some associated sinus pressure as well.  No other treatments tried.  No other obvious alleviating or aggravating factors.  Hyperglycemia Patient found to have glucose of 273 in the ED. He is not currently taking any medication. He has been on metformin in the past and has done well with this.  No current polyuria polydipsia.  No fatigue.   ROS: Per HPI  PMH: He reports that he has quit smoking. His smoking use included cigarettes. He has never used smokeless tobacco. He reports that he does not drink alcohol or use drugs.      Objective/Observations   NAD, speaking in full sentences.   Telephone Visit   I connected with Edward Burton on 06/23/18 at  1:00 PM EDT via telephone and verified that I am speaking with the correct person using two identifiers. I discussed the limitations of evaluation and management by telemedicine and the availability of in person appointments. The patient expressed understanding and agreed to proceed.   Patient  location: Home Provider location: [place] participating in the virtual visit: Myself and patient      Algis Greenhouse. Jerline Pain, MD 06/23/2018 1:20 PM

## 2018-06-23 NOTE — Telephone Encounter (Signed)
Copied from [place] [id number]. Topic: Appointment Scheduling - Scheduling Inquiry for Clinic >> Jun 22, 2018  4:34 PM Edward Burton wrote: Reason for CRM:   Pt would like to schedule an appointment for sinus problems that have not resolved.

## 2018-07-17 ENCOUNTER — Ambulatory Visit: Payer: Self-pay | Admitting: Family Medicine

## 2018-07-17 NOTE — Telephone Encounter (Signed)
Forwarding to Dr. Hunter to advise.  

## 2018-07-17 NOTE — Telephone Encounter (Signed)
Dr. Jerline Pain patient- forwarding to him

## 2018-07-17 NOTE — Telephone Encounter (Signed)
Please contact pt w/ advice per Triage note.

## 2018-07-17 NOTE — Telephone Encounter (Signed)
Pt. Reports he just finished antibiotics and Prednisone for sinus infection. Does not routinely check his blood sugar. Reports yesterday before breakfast it was 224. This morning after breakfast and lunch it was 369. Only symptom is voiding frequently. Please advise pt. Contact number [phone].  Answer Assessment - Initial Assessment Questions 1. BLOOD GLUCOSE: "What is your blood glucose level?"      369 after breakfast and lunch 2. ONSET: "When did you check the blood glucose?"     Today 3. USUAL RANGE: "What is your glucose level usually?" (e.g., usual fasting morning value, usual evening value)     Does not check it 4. KETONES: "Do you check for ketones (urine or blood test strips)?" If yes, ask: "What does the test show now?"      No 5. TYPE 1 or 2:  "Do you know what type of diabetes you have?"  (e.g., Type 1, Type 2, Gestational; doesn't know)      Type 2 6. INSULIN: "Do you take insulin?" "What type of insulin(s) do you use? What is the mode of delivery? (syringe, pen (e.g., injection or  pump)?"      No 7. DIABETES PILLS: "Do you take any pills for your diabetes?" If yes, ask: "Have you missed taking any pills recently?"     Yes 8. OTHER SYMPTOMS: "Do you have any symptoms?" (e.g., fever, frequent urination, difficulty breathing, dizziness, weakness, vomiting)     Voiding frequently 9. PREGNANCY: "Is there any chance you are pregnant?" "When was your last menstrual period?"     n/a  Protocols used: DIABETES - HIGH BLOOD SUGAR-A-AH

## 2018-07-20 NOTE — Telephone Encounter (Signed)
Please call and check on pt. Blood sugars should be improving now that he is off prednisone.  Algis Greenhouse. Jerline Pain, MD 07/20/2018 8:05 AM

## 2018-07-21 NOTE — Telephone Encounter (Signed)
LM for patient to return call.

## 2018-07-23 ENCOUNTER — Other Ambulatory Visit: Payer: Self-pay

## 2018-07-23 DIAGNOSIS — Z8639 Personal history of other endocrine, nutritional and metabolic disease: Secondary | ICD-10-CM

## 2018-07-23 MED ORDER — FREESTYLE SYSTEM KIT: 1.0000 | PACK | 0 refills | Status: DC | PRN

## 2018-07-23 NOTE — Telephone Encounter (Signed)
Patient stated he is doing good Blood sugars is down last Bs 129. FYI

## 2018-07-24 NOTE — Telephone Encounter (Signed)
Noted  

## 2018-07-24 NOTE — Telephone Encounter (Signed)
Noted. Recommend routine follow up unless blood sugars spike again.  Algis Greenhouse. Jerline Pain, MD 07/24/2018 8:44 AM

## 2018-07-29 ENCOUNTER — Telehealth: Payer: Self-pay

## 2018-07-29 NOTE — Telephone Encounter (Signed)
Copied from [place] [phone]. Topic: General - Other >> Jul 29, 2018  4:38 PM Parke Poisson wrote: Reason for CRM: Pt's wife Freda Munro called stating that insurance does not cover glucose monitoring kit (FREESTYLE) monitoring kit but will cover Accu check.[id number] [phone](Phone) [phone](Fax)

## 2018-07-30 ENCOUNTER — Other Ambulatory Visit: Payer: Self-pay

## 2018-07-30 DIAGNOSIS — Z8639 Personal history of other endocrine, nutritional and metabolic disease: Secondary | ICD-10-CM

## 2018-07-30 MED ORDER — BLOOD GLUCOSE MONITOR KIT: PACK | 0 refills | Status: DC

## 2018-07-30 MED ORDER — BLOOD GLUCOSE MONITOR KIT: PACK | 0 refills | Status: AC

## 2018-07-30 NOTE — Telephone Encounter (Signed)
Rx sent 

## 2018-10-18 ENCOUNTER — Other Ambulatory Visit: Payer: Self-pay | Admitting: Family Medicine

## 2018-10-20 ENCOUNTER — Telehealth: Payer: Self-pay | Admitting: Family Medicine

## 2018-10-20 NOTE — Telephone Encounter (Signed)
There is a 9:20 on 10/22 that he could have.  I put a hold on it.

## 2018-10-20 NOTE — Telephone Encounter (Signed)
Copied from [place] [id number]. Topic: General - Other >> Oct 20, 2018 11:37 AM Leward Quan A wrote: Reason for CRM: Per patient he need to have his Physical by 10/30/2018 asking for assistance please  Please Advise if patient can be worked in before 10/30/18 for CPE or scheduled with another provider? Thank you!

## 2018-10-29 ENCOUNTER — Encounter: Payer: Self-pay | Admitting: Family Medicine

## 2018-10-29 ENCOUNTER — Other Ambulatory Visit: Payer: Self-pay

## 2018-10-29 ENCOUNTER — Ambulatory Visit (INDEPENDENT_AMBULATORY_CARE_PROVIDER_SITE_OTHER): Payer: BLUE CROSS/BLUE SHIELD | Admitting: Family Medicine

## 2018-10-29 VITALS — BP 128/79 | HR 73 | Temp 98.4°F | Ht 71.0 in | Wt 319.0 lb

## 2018-10-29 DIAGNOSIS — I10 Essential (primary) hypertension: Secondary | ICD-10-CM

## 2018-10-29 DIAGNOSIS — E785 Hyperlipidemia, unspecified: Secondary | ICD-10-CM | POA: Diagnosis not present

## 2018-10-29 DIAGNOSIS — I5020 Unspecified systolic (congestive) heart failure: Secondary | ICD-10-CM

## 2018-10-29 DIAGNOSIS — Z0001 Encounter for general adult medical examination with abnormal findings: Secondary | ICD-10-CM

## 2018-10-29 DIAGNOSIS — J309 Allergic rhinitis, unspecified: Secondary | ICD-10-CM

## 2018-10-29 DIAGNOSIS — J449 Chronic obstructive pulmonary disease, unspecified: Secondary | ICD-10-CM

## 2018-10-29 DIAGNOSIS — Z6841 Body Mass Index (BMI) 40.0 and over, adult: Secondary | ICD-10-CM

## 2018-10-29 DIAGNOSIS — Z125 Encounter for screening for malignant neoplasm of prostate: Secondary | ICD-10-CM | POA: Diagnosis not present

## 2018-10-29 DIAGNOSIS — R739 Hyperglycemia, unspecified: Secondary | ICD-10-CM | POA: Diagnosis not present

## 2018-10-29 DIAGNOSIS — Z23 Encounter for immunization: Secondary | ICD-10-CM | POA: Diagnosis not present

## 2018-10-29 LAB — CBC
HCT: 35.9 % — ABNORMAL LOW (ref 39.0–52.0)
Hemoglobin: 11.6 g/dL — ABNORMAL LOW (ref 13.0–17.0)
MCHC: 32.3 g/dL (ref 30.0–36.0)
MCV: 85.2 fl (ref 78.0–100.0)
Platelets: 189 10*3/uL (ref 150.0–400.0)
RBC: 4.21 Mil/uL — ABNORMAL LOW (ref 4.22–5.81)
RDW: 14.1 % (ref 11.5–15.5)
WBC: 6.5 10*3/uL (ref 4.0–10.5)

## 2018-10-29 LAB — COMPREHENSIVE METABOLIC PANEL
ALT: 33 U/L (ref 0–53)
AST: 28 U/L (ref 0–37)
Albumin: 3.9 g/dL (ref 3.5–5.2)
Alkaline Phosphatase: 57 U/L (ref 39–117)
BUN: 10 mg/dL (ref 6–23)
CO2: 29 mEq/L (ref 19–32)
Calcium: 8.8 mg/dL (ref 8.4–10.5)
Chloride: 102 mEq/L (ref 96–112)
Creatinine, Ser: 0.92 mg/dL (ref 0.40–1.50)
GFR: 102.44 mL/min (ref >60.00)
Glucose, Bld: 149 mg/dL — ABNORMAL HIGH (ref 70–99)
Potassium: 4.2 mEq/L (ref 3.5–5.1)
Sodium: 138 mEq/L (ref 135–145)
Total Bilirubin: 0.6 mg/dL (ref 0.2–1.2)
Total Protein: 6.9 g/dL (ref 6.0–8.3)

## 2018-10-29 LAB — LIPID PANEL
Cholesterol: 157 mg/dL (ref 0–200)
HDL: 35.2 mg/dL — ABNORMAL LOW (ref >39.00)
LDL Cholesterol: 99 mg/dL (ref 0–99)
NonHDL: 121.76
Total CHOL/HDL Ratio: 4
Triglycerides: 114 mg/dL (ref 0.0–149.0)
VLDL: 22.8 mg/dL (ref 0.0–40.0)

## 2018-10-29 LAB — PSA: PSA: 0.55 ng/mL (ref 0.10–4.00)

## 2018-10-29 LAB — HEMOGLOBIN A1C: Hgb A1c MFr Bld: 8.9 % — ABNORMAL HIGH (ref 4.6–6.5)

## 2018-10-29 LAB — TSH: TSH: 2.78 u[IU]/mL (ref 0.35–4.50)

## 2018-10-29 MED ORDER — BUDESONIDE-FORMOTEROL FUMARATE 160-4.5 MCG/ACT IN AERO: INHALATION_SPRAY | RESPIRATORY_TRACT | 3 refills | Status: DC

## 2018-10-29 MED ORDER — GABAPENTIN 300 MG PO CAPS: 300.0000 mg | ORAL_CAPSULE | Freq: Two times a day (BID) | ORAL | 0 refills | Status: DC

## 2018-10-29 MED ORDER — IPRATROPIUM-ALBUTEROL 20-100 MCG/ACT IN AERS: 1.0000 | INHALATION_SPRAY | Freq: Four times a day (QID) | RESPIRATORY_TRACT | 5 refills | Status: DC | PRN

## 2018-10-29 MED ORDER — METFORMIN HCL ER 750 MG PO TB24: 750.0000 mg | ORAL_TABLET | Freq: Every day | ORAL | 3 refills | Status: DC

## 2018-10-29 MED ORDER — IPRATROPIUM-ALBUTEROL 0.5-2.5 (3) MG/3ML IN SOLN: 3.0000 mL | RESPIRATORY_TRACT | 0 refills | Status: DC | PRN

## 2018-10-29 MED ORDER — ALBUTEROL SULFATE (2.5 MG/3ML) 0.083% IN NEBU: 2.5000 mg | INHALATION_SOLUTION | Freq: Four times a day (QID) | RESPIRATORY_TRACT | 1 refills | Status: DC | PRN

## 2018-10-29 MED ORDER — ASPIRIN 81 MG PO TBEC: 81.0000 mg | DELAYED_RELEASE_TABLET | Freq: Every day | ORAL | 3 refills | Status: AC

## 2018-10-29 MED ORDER — MONTELUKAST SODIUM 10 MG PO TABS: 10.0000 mg | ORAL_TABLET | Freq: Every evening | ORAL | 3 refills | Status: DC

## 2018-10-29 NOTE — Assessment & Plan Note (Signed)
Check lipid panel  
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
## 2018-10-29 NOTE — Assessment & Plan Note (Signed)
No recent flares.  Continue Symbicort.  Continue Combivent and albuterol as needed.

## 2018-10-29 NOTE — Patient Instructions (Signed)
It was very nice to see you today!  We will check blood work today.  Please continue working on your diet and exercise.  We will give you a flu vaccine today.  I sent your medications.  I would like to see back in a year for your physical.  We may need to see you sooner depending on your blood work.  Take care, Dr Jerline Pain  Please try these tips to maintain a healthy lifestyle:   Eat at least 3 REAL meals and 1-2 snacks per day.  Aim for no more than 5 hours between eating.  If you eat breakfast, please do so within one hour of getting up.    Obtain twice as many fruits/vegetables as protein or carbohydrate foods for both lunch and dinner. (Half of each meal should be fruits/vegetables, one quarter protein, and one quarter starchy carbs)   Cut down on sweet beverages. This includes juice, soda, and sweet tea.    Exercise at least 150 minutes every week.

## 2018-10-29 NOTE — Assessment & Plan Note (Signed)
Check A1c.  Continue Metformin 750 mg daily.

## 2018-10-29 NOTE — Assessment & Plan Note (Signed)
Patient alternates between Claritin, Zyrtec, and Allegra.  He will continue using these in addition to Singulair and Flonase.

## 2018-10-29 NOTE — Assessment & Plan Note (Signed)
Continue management per cardiology.  Check CBC, C met, and TSH.  No signs of volume overload today.

## 2018-10-29 NOTE — Assessment & Plan Note (Signed)
At goal.  Continue lisinopril 10 mg daily, Coreg 6.25 mg twice daily, and spironolactone 25 mg daily.

## 2018-10-30 NOTE — Progress Notes (Signed)
Please inform patient of the following:  Blood sugar has increased since his last set of blood work. Recommend that we increase metformin to 1000mg  twice daily. Please send in for patient. Would like for him to come back to recheck in 3 months.  All of his other labs are STABLE.   Algis Greenhouse. Jerline Pain, MD 10/30/2018 12:01 PM

## 2018-11-02 ENCOUNTER — Other Ambulatory Visit: Payer: Self-pay

## 2018-11-02 MED ORDER — METFORMIN HCL 1000 MG PO TABS: 1000.0000 mg | ORAL_TABLET | Freq: Two times a day (BID) | ORAL | 3 refills | Status: DC

## 2018-11-14 ENCOUNTER — Other Ambulatory Visit: Payer: Self-pay | Admitting: Family Medicine

## 2018-11-17 ENCOUNTER — Encounter: Payer: BLUE CROSS/BLUE SHIELD | Admitting: Family Medicine

## 2018-12-04 ENCOUNTER — Ambulatory Visit: Payer: Self-pay

## 2018-12-04 NOTE — Telephone Encounter (Signed)
Patient called stating that he has nasal drainage sore throat and mild SOB. He states that he is disabled and is unaware of any exposure to COVID-19. He has no fever. He has chest congestion. His daughter was exposed to COVID-19 but has since been tested negative. She is being treated for strep throat. Per protocol appointment scheduled for am. Patient is aware someone will call in am. Care advice read to patient. He verbalized understanding. He will go get tested in his area. He lives in [place].  Reason for Disposition . Fever present > 3 days (72 hours)  Answer Assessment - Initial Assessment Questions 1. COVID-19 DIAGNOSIS: "Who made your Coronavirus (COVID-19) diagnosis?" "Was it confirmed by a positive lab test?" If not diagnosed by a HCP, ask "Are there lots of cases (community spread) where you live?" (See public health department website, if unsure)     Lives in [place] 2. COVID-19 EXPOSURE: "Was there any known exposure to [place] before the symptoms began?" CDC Definition of close contact: within 6 feet (2 meters) for a total of 15 minutes or more over a 24-hour period.      none 3. ONSET: "When did the COVID-19 symptoms start?"      2 days ago 4. WORST SYMPTOM: "What is your worst symptom?" (e.g., cough, fever, shortness of breath, muscle aches)     Sore throat sinus drainage 5. COUGH: "Do you have a cough?" If so, ask: "How bad is the cough?"       no 6. FEVER: "Do you have a fever?" If so, ask: "What is your temperature, how was it measured, and when did it start?"     no 7. RESPIRATORY STATUS: "Describe your breathing?" (e.g., shortness of breath, wheezing, unable to speak)      SOB 8. BETTER-SAME-WORSE: "Are you getting better, staying the same or getting worse compared to yesterday?"  If getting worse, ask, "In what way?"    same 9. HIGH RISK DISEASE: "Do you have any chronic medical problems?" (e.g., asthma, heart or lung disease, weak immune system, obesity, etc.)    Bronchitis  and asthma 10. PREGNANCY: "Is there any chance you are pregnant?" "When was your last menstrual period?"       N/A 11. OTHER SYMPTOMS: "Do you have any other symptoms?"  (e.g., chills, fatigue, headache, loss of smell or taste, muscle pain, sore throat; new loss of smell or taste especially support the diagnosis of COVID-19)       Sore throat,no has athritis  Protocols used: CORONAVIRUS (COVID-19) DIAGNOSED OR SUSPECTED-A-AH

## 2018-12-05 ENCOUNTER — Ambulatory Visit (INDEPENDENT_AMBULATORY_CARE_PROVIDER_SITE_OTHER): Payer: BLUE CROSS/BLUE SHIELD | Admitting: Family Medicine

## 2018-12-05 ENCOUNTER — Encounter: Payer: Self-pay | Admitting: Family Medicine

## 2018-12-05 VITALS — Ht 71.0 in | Wt 300.0 lb

## 2018-12-05 DIAGNOSIS — J449 Chronic obstructive pulmonary disease, unspecified: Secondary | ICD-10-CM

## 2018-12-05 DIAGNOSIS — R0982 Postnasal drip: Secondary | ICD-10-CM

## 2018-12-05 MED ORDER — PREDNISONE 50 MG PO TABS: ORAL_TABLET | ORAL | 0 refills | Status: DC

## 2018-12-05 MED ORDER — IPRATROPIUM BROMIDE 0.03 % NA SOLN: 2.0000 | Freq: Two times a day (BID) | NASAL | 0 refills | Status: DC

## 2018-12-05 NOTE — Progress Notes (Signed)
    I connected with Edward Burton on 12/05/18 at  9:00 AM EST by video and verified that I am speaking with the correct person using two identifiers.   I discussed the limitations, risks, security and privacy concerns of performing an evaluation and management service by video and the availability of in person appointments. I also discussed with the patient that there may be a patient responsible charge related to this service. The patient expressed understanding and agreed to proceed.  Patient location: Home Provider Location: [place] Participants: Lesleigh Noe and Edward Burton   Subjective:     Edward Burton is a 58 y.o. male presenting for URI     URI  This is a new problem. The current episode started in the past 7 days. The problem has been unchanged. There has been no fever. Associated symptoms include congestion, a sore throat and wheezing. Pertinent negatives include no chest pain, diarrhea, nausea or vomiting. Treatments tried: nasal spray.   Endorses a lot of wheezing w/ COPD  No know covid exposure  Review of Systems  Constitutional: Negative for chills and fever.  HENT: Positive for congestion, postnasal drip and sore throat.   Respiratory: Positive for shortness of breath and wheezing.   Cardiovascular: Negative for chest pain.  Gastrointestinal: Negative for diarrhea, nausea and vomiting.     Social History   Tobacco Use  Smoking Status Former Smoker  . Types: Cigarettes  Smokeless Tobacco Never Used        Objective:   BP Readings from Last 3 Encounters:  10/29/18 128/79  05/16/18 (!) 145/80  11/18/17 132/76   Wt Readings from Last 3 Encounters:  12/05/18 300 lb (136.1 kg)  10/29/18 (!) 319 lb (144.7 kg)  05/16/18 300 lb (136.1 kg)   Pt unable to check VS at home  Physical Exam Constitutional:      Appearance: Normal appearance. He is not ill-appearing.  HENT:     Head: Normocephalic and atraumatic.     Right Ear:  External ear normal.     Left Ear: External ear normal.  Eyes:     Conjunctiva/sclera: Conjunctivae normal.  Pulmonary:     Effort: Pulmonary effort is normal. No respiratory distress.  Neurological:     Mental Status: He is alert. Mental status is at baseline.  Psychiatric:        Mood and Affect: Mood normal.        Behavior: Behavior normal.        Thought Content: Thought content normal.        Judgment: Judgment normal.             Assessment & Plan:   Problem List Items Addressed This Visit      Respiratory   COPD (chronic obstructive pulmonary disease) ([place]) - Primary   Relevant Medications   ipratropium (ATROVENT) 0.03 % nasal spray   predniSONE (DELTASONE) 50 MG tablet    Other Visit Diagnoses    Post-nasal drip       Relevant Medications   ipratropium (ATROVENT) 0.03 % nasal spray     Pt well appearing. Speaking in complete sentences w/o distress. Suspect mild URI with mild COPD exacerbation. Advised starting steroids (per normal treatment from PCP) and f/u Monday if no improvement to add Abx.   Pt already scheduled for Covid testing tomorrow  Return if symptoms worsen or fail to improve.  Lesleigh Noe, MD

## 2018-12-27 ENCOUNTER — Other Ambulatory Visit: Payer: Self-pay | Admitting: Family Medicine

## 2018-12-27 DIAGNOSIS — R0982 Postnasal drip: Secondary | ICD-10-CM

## 2018-12-27 DIAGNOSIS — J449 Chronic obstructive pulmonary disease, unspecified: Secondary | ICD-10-CM

## 2019-01-11 ENCOUNTER — Other Ambulatory Visit: Payer: Self-pay | Admitting: Emergency Medicine

## 2019-01-11 DIAGNOSIS — J449 Chronic obstructive pulmonary disease, unspecified: Secondary | ICD-10-CM

## 2019-01-11 DIAGNOSIS — R0982 Postnasal drip: Secondary | ICD-10-CM

## 2019-01-11 MED ORDER — IPRATROPIUM BROMIDE 0.03 % NA SOLN: 2.0000 | Freq: Two times a day (BID) | NASAL | 3 refills | Status: DC

## 2019-02-16 ENCOUNTER — Other Ambulatory Visit: Payer: Self-pay | Admitting: Emergency Medicine

## 2019-02-16 MED ORDER — FUROSEMIDE 40 MG PO TABS: 40.0000 mg | ORAL_TABLET | Freq: Every day | ORAL | 1 refills | Status: DC

## 2019-04-06 ENCOUNTER — Other Ambulatory Visit: Payer: Self-pay | Admitting: Family Medicine

## 2019-04-06 DIAGNOSIS — J449 Chronic obstructive pulmonary disease, unspecified: Secondary | ICD-10-CM

## 2019-04-06 DIAGNOSIS — R0982 Postnasal drip: Secondary | ICD-10-CM

## 2019-04-12 ENCOUNTER — Encounter: Payer: Self-pay | Admitting: Family Medicine

## 2019-04-12 ENCOUNTER — Other Ambulatory Visit: Payer: Self-pay

## 2019-04-12 ENCOUNTER — Ambulatory Visit (INDEPENDENT_AMBULATORY_CARE_PROVIDER_SITE_OTHER): Payer: BLUE CROSS/BLUE SHIELD | Admitting: Family Medicine

## 2019-04-12 VITALS — BP 140/70 | HR 77 | Temp 97.0°F | Ht 71.0 in | Wt 319.4 lb

## 2019-04-12 DIAGNOSIS — R739 Hyperglycemia, unspecified: Secondary | ICD-10-CM

## 2019-04-12 DIAGNOSIS — R109 Unspecified abdominal pain: Secondary | ICD-10-CM

## 2019-04-12 LAB — POCT GLYCOSYLATED HEMOGLOBIN (HGB A1C): Hemoglobin A1C: 7.9 % — AB (ref 4.0–5.6)

## 2019-04-12 LAB — POCT URINALYSIS DIPSTICK
Bilirubin, UA: NEGATIVE
Blood, UA: NEGATIVE
Glucose, UA: NEGATIVE
Ketones, UA: POSITIVE
Leukocytes, UA: NEGATIVE
Nitrite, UA: NEGATIVE
Protein, UA: POSITIVE — AB
Spec Grav, UA: 1.015 (ref 1.010–1.025)
Urobilinogen, UA: 0.2 E.U./dL
pH, UA: 7.5 (ref 5.0–8.0)

## 2019-04-12 MED ORDER — TIZANIDINE HCL 4 MG PO TABS: 4.0000 mg | ORAL_TABLET | Freq: Four times a day (QID) | ORAL | 0 refills | Status: DC | PRN

## 2019-04-12 MED ORDER — DICLOFENAC SODIUM 75 MG PO TBEC: 75.0000 mg | DELAYED_RELEASE_TABLET | Freq: Two times a day (BID) | ORAL | 0 refills | Status: DC

## 2019-04-12 MED ORDER — OZEMPIC (0.25 OR 0.5 MG/DOSE) 2 MG/1.5ML ~~LOC~~ SOPN: 0.5000 mg | PEN_INJECTOR | SUBCUTANEOUS | 5 refills | Status: DC

## 2019-04-12 NOTE — Assessment & Plan Note (Addendum)
A1c improved to 7.9, though still above goal..  Continue Metformin 1000 mg twice daily.  Also start Ozempic 0.25 mg once weekly.  Discussed potential side effects.  Follow-up in 3 months to recheck A1c.

## 2019-04-12 NOTE — Progress Notes (Signed)
   Edward Burton is a 59 y.o. male who presents today for an office visit.  Assessment/Plan:  New/Acute Problems: Low back pain No red flags.  Consistent with muscular strain.  Start diclofenac and Zanaflex.  Patient is concerned about infection.  UA without any signs of infection though also check urine culture.  Can use heating pad as needed.  Gust reasons to return to care.  Follow-up as needed.  Chronic Problems Addressed Today: Hyperglycemia A1c improved to 7.9, though still above goal..  Continue Metformin 1000 mg twice daily.  Also start Ozempic 0.25 mg once weekly.  Discussed potential side effects.  Follow-up in 3 months to recheck A1c.     Subjective:  HPI:  Pain started about a week ago. Worse with urination. Located on both sides of back. No hematuria. Some odor in urine. Tried drinking water which has not helped.  No other treatments tried.  No bowel or bladder incontinence.  No urinary retention.  He has been cutting out starches over the diet.  Thinks this is helped with his blood sugar.       Objective:  Physical Exam: BP 140/70 (BP Location: Left Arm, Patient Position: Sitting, Cuff Size: Large)   Pulse 77   Temp (!) 97 F (36.1 C) (Temporal)   Ht 5\' 11"  (1.803 m)   Wt (!) 319 lb 6.4 oz (144.9 kg)   SpO2 95%   BMI 44.55 kg/m   Wt Readings from Last 3 Encounters:  04/12/19 (!) 319 lb 6.4 oz (144.9 kg)  12/05/18 300 lb (136.1 kg)  10/29/18 (!) 319 lb (144.7 kg)    Gen: No acute distress, resting comfortably CV: Regular rate and rhythm with no murmurs appreciated Pulm: Normal work of breathing, clear to auscultation bilaterally with no crackles, wheezes, or rhonchi Neuro: Grossly normal, moves all extremities Psych: Normal affect and thought content MSK: Back without midline tenderness.  Tender to palpation along bilateral lumbar paraspinal muscles.      Algis Greenhouse. Jerline Pain, MD 04/12/2019 4:09 PM

## 2019-04-12 NOTE — Patient Instructions (Signed)
It was very nice to see you today!  I think he has a back strain.  Please start the diclofenac and muscle relaxer.  We also start Ozempic today.  This should help with your blood sugar and also help with weight loss.  Come back in 3 months to recheck your blood sugar.  Take care, Dr Jerline Pain  Please try these tips to maintain a healthy lifestyle:   Eat at least 3 REAL meals and 1-2 snacks per day.  Aim for no more than 5 hours between eating.  If you eat breakfast, please do so within one hour of getting up.    Each meal should contain half fruits/vegetables, one quarter protein, and one quarter carbs (no bigger than a computer mouse)   Cut down on sweet beverages. This includes juice, soda, and sweet tea.     Drink at least 1 glass of water with each meal and aim for at least 8 glasses per day   Exercise at least 150 minutes every week.

## 2019-04-13 LAB — URINE CULTURE
MICRO NUMBER:: 10327625
Result:: NO GROWTH
SPECIMEN QUALITY:: ADEQUATE

## 2019-04-14 NOTE — Progress Notes (Signed)
Please inform patient of the following:  Urine culture negative - no signs of UTI.  Algis Greenhouse. Jerline Pain, MD 04/14/2019 8:04 AM

## 2019-04-19 ENCOUNTER — Encounter: Payer: Self-pay | Admitting: Family Medicine

## 2019-04-19 ENCOUNTER — Telehealth (INDEPENDENT_AMBULATORY_CARE_PROVIDER_SITE_OTHER): Payer: BLUE CROSS/BLUE SHIELD | Admitting: Family Medicine

## 2019-04-19 DIAGNOSIS — J309 Allergic rhinitis, unspecified: Secondary | ICD-10-CM

## 2019-04-19 DIAGNOSIS — J449 Chronic obstructive pulmonary disease, unspecified: Secondary | ICD-10-CM | POA: Diagnosis not present

## 2019-04-19 MED ORDER — DOXYCYCLINE HYCLATE 100 MG PO TABS: 100.0000 mg | ORAL_TABLET | Freq: Two times a day (BID) | ORAL | 0 refills | Status: DC

## 2019-04-19 MED ORDER — PREDNISONE 50 MG PO TABS: ORAL_TABLET | ORAL | 0 refills | Status: DC

## 2019-04-19 NOTE — Assessment & Plan Note (Signed)
Flare due to pollen/allergies.  We will continue his Symbicort, Combivent, and albuterol.  Will start prednisone and doxycycline for acute flare.

## 2019-04-19 NOTE — Assessment & Plan Note (Signed)
Concern for possible sinus infection though symptoms only present for few days.  She did have some improvement with the prednisone and doxycycline we are starting for a COPD flare.  Given his recurrent issues with sinus infections will place referral to ENT.

## 2019-04-19 NOTE — Progress Notes (Signed)
   Edward Burton is a 59 y.o. male who presents today for a virtual office visit.  Assessment/Plan:  Chronic Problems Addressed Today: Allergic rhinitis Concern for possible sinus infection though symptoms only present for few days.  She did have some improvement with the prednisone and doxycycline we are starting for a COPD flare.  Given his recurrent issues with sinus infections will place referral to ENT.  COPD (chronic obstructive pulmonary disease) (HCC) Flare due to pollen/allergies.  We will continue his Symbicort, Combivent, and albuterol.  Will start prednisone and doxycycline for acute flare.     Subjective:  HPI:  Symptoms started a few days ago.  Initially located in sinuses.  Felt more like a sinus infection with rhinorrhea and congestion.  Over last few days he started developing more respiratory symptoms.  More cough and sputum production.  Will increase shortness of breath as well.  He has been using his albuterol inhaler with modest improvement.  His COPD typically flares up this time a year due to allergies/pollen.       Objective/Observations  Physical Exam: Gen: NAD, resting comfortably Pulm: Normal work of breathing Neuro: Grossly normal, moves all extremities Psych: Normal affect and thought content  Virtual Visit via Video   I connected with Edward Burton on 04/19/19 at  1:00 PM EDT by a video enabled telemedicine application and verified that I am speaking with the correct person using two identifiers. The limitations of evaluation and management by telemedicine and the availability of in person appointments were discussed. The patient expressed understanding and agreed to proceed.   Patient location: Home Provider location: [place] participating in the virtual visit: Myself and Patient     Algis Greenhouse. Jerline Pain, MD 04/19/2019 1:23 PM

## 2019-05-04 ENCOUNTER — Other Ambulatory Visit: Payer: Self-pay | Admitting: Family Medicine

## 2019-05-04 DIAGNOSIS — R0982 Postnasal drip: Secondary | ICD-10-CM

## 2019-05-04 DIAGNOSIS — J449 Chronic obstructive pulmonary disease, unspecified: Secondary | ICD-10-CM

## 2019-05-18 ENCOUNTER — Other Ambulatory Visit: Payer: Self-pay | Admitting: Family Medicine

## 2019-05-18 DIAGNOSIS — J449 Chronic obstructive pulmonary disease, unspecified: Secondary | ICD-10-CM

## 2019-05-18 DIAGNOSIS — R0982 Postnasal drip: Secondary | ICD-10-CM

## 2019-05-21 ENCOUNTER — Ambulatory Visit (INDEPENDENT_AMBULATORY_CARE_PROVIDER_SITE_OTHER): Payer: BLUE CROSS/BLUE SHIELD | Admitting: Otolaryngology

## 2019-05-21 ENCOUNTER — Encounter (INDEPENDENT_AMBULATORY_CARE_PROVIDER_SITE_OTHER): Payer: Self-pay | Admitting: Otolaryngology

## 2019-05-21 ENCOUNTER — Other Ambulatory Visit: Payer: Self-pay

## 2019-05-21 VITALS — Temp 97.3°F

## 2019-05-21 DIAGNOSIS — J310 Chronic rhinitis: Secondary | ICD-10-CM | POA: Diagnosis not present
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
## 2019-05-24 ENCOUNTER — Other Ambulatory Visit (INDEPENDENT_AMBULATORY_CARE_PROVIDER_SITE_OTHER): Payer: Self-pay

## 2019-05-24 DIAGNOSIS — J329 Chronic sinusitis, unspecified: Secondary | ICD-10-CM

## 2019-05-29 ENCOUNTER — Other Ambulatory Visit: Payer: Self-pay | Admitting: Family Medicine

## 2019-05-29 DIAGNOSIS — J449 Chronic obstructive pulmonary disease, unspecified: Secondary | ICD-10-CM

## 2019-05-29 DIAGNOSIS — R0982 Postnasal drip: Secondary | ICD-10-CM

## 2019-06-04 ENCOUNTER — Ambulatory Visit: Payer: BLUE CROSS/BLUE SHIELD | Admitting: Family Medicine

## 2019-06-04 ENCOUNTER — Ambulatory Visit
Admission: RE | Admit: 2019-06-04 | Discharge: 2019-06-04 | Disposition: A | Discharge status: Home / Self Care | Payer: BLUE CROSS/BLUE SHIELD | Source: Ambulatory Visit | Attending: Otolaryngology | Admitting: Otolaryngology

## 2019-06-04 DIAGNOSIS — J329 Chronic sinusitis, unspecified: Secondary | ICD-10-CM

## 2019-06-09 ENCOUNTER — Other Ambulatory Visit (INDEPENDENT_AMBULATORY_CARE_PROVIDER_SITE_OTHER): Payer: Self-pay

## 2019-06-11 ENCOUNTER — Ambulatory Visit (INDEPENDENT_AMBULATORY_CARE_PROVIDER_SITE_OTHER): Payer: BLUE CROSS/BLUE SHIELD | Admitting: Otolaryngology

## 2019-06-11 ENCOUNTER — Other Ambulatory Visit: Payer: Self-pay

## 2019-06-11 ENCOUNTER — Encounter (INDEPENDENT_AMBULATORY_CARE_PROVIDER_SITE_OTHER): Payer: Self-pay | Admitting: Otolaryngology

## 2019-06-11 VITALS — Temp 96.8°F

## 2019-06-11 DIAGNOSIS — J310 Chronic rhinitis: Secondary | ICD-10-CM
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
## 2019-07-07 ENCOUNTER — Other Ambulatory Visit: Payer: Self-pay | Admitting: Family Medicine

## 2019-07-07 DIAGNOSIS — Z8639 Personal history of other endocrine, nutritional and metabolic disease: Secondary | ICD-10-CM

## 2019-07-07 NOTE — Telephone Encounter (Signed)
Rx Request 

## 2019-07-07 NOTE — Telephone Encounter (Signed)
..   LAST APPOINTMENT DATE: 04/22/2019   NEXT APPOINTMENT DATE:@7 /09/2019  MEDICATION:gabapentin (NEURONTIN) 300 MG capsule  diclofenac (VOLTAREN) 75 MG EC tablet  tiZANidine (ZANAFLEX) 4 MG tablet  PHARMACY:[id number] - [place], [place] - [address]  **Let patient know to contact pharmacy at the end of the day to make sure medication is ready. **  ** Please notify patient to allow 48-72 hours to process**  **Encourage patient to contact the pharmacy for refills or they can request refills through [facility]**  CLINICAL FILLS OUT ALL BELOW:   LAST REFILL:  QTY:  REFILL DATE:    OTHER COMMENTS:    Okay for refill?  Please advise

## 2019-07-09 ENCOUNTER — Other Ambulatory Visit: Payer: Self-pay

## 2019-07-09 MED ORDER — GABAPENTIN 300 MG PO CAPS: 300.0000 mg | ORAL_CAPSULE | Freq: Two times a day (BID) | ORAL | 0 refills | Status: DC

## 2019-07-09 NOTE — Telephone Encounter (Signed)
Ok to send in rx.  Algis Greenhouse. Jerline Pain, MD 07/09/2019 9:32 AM

## 2019-07-09 NOTE — Telephone Encounter (Signed)
Not able to send Gabapentin its printing

## 2019-07-13 MED ORDER — GABAPENTIN 300 MG PO CAPS
300.0000 mg | ORAL_CAPSULE | Freq: Two times a day (BID) | ORAL | 3 refills | Status: DC
Start: 2019-07-13 — End: 2019-11-12

## 2019-07-13 NOTE — Addendum Note (Signed)
Addended by: Vivi Barrack on: 07/13/2019 08:00 AM   Modules accepted: Orders

## 2019-07-16 ENCOUNTER — Ambulatory Visit (INDEPENDENT_AMBULATORY_CARE_PROVIDER_SITE_OTHER): Payer: BLUE CROSS/BLUE SHIELD | Admitting: Family Medicine

## 2019-07-16 ENCOUNTER — Encounter: Payer: Self-pay | Admitting: Family Medicine

## 2019-07-16 ENCOUNTER — Other Ambulatory Visit: Payer: Self-pay

## 2019-07-16 VITALS — BP 130/78 | HR 66 | Temp 98.2°F | Ht 71.0 in | Wt 304.2 lb

## 2019-07-16 DIAGNOSIS — Z8639 Personal history of other endocrine, nutritional and metabolic disease: Secondary | ICD-10-CM | POA: Diagnosis not present

## 2019-07-16 DIAGNOSIS — E785 Hyperlipidemia, unspecified: Secondary | ICD-10-CM

## 2019-07-16 DIAGNOSIS — I10 Essential (primary) hypertension: Secondary | ICD-10-CM | POA: Diagnosis not present

## 2019-07-16 DIAGNOSIS — R739 Hyperglycemia, unspecified: Secondary | ICD-10-CM

## 2019-07-16 LAB — POCT GLYCOSYLATED HEMOGLOBIN (HGB A1C): Hemoglobin A1C: 6.1 % — AB (ref 4.0–5.6)

## 2019-07-16 MED ORDER — DICLOFENAC SODIUM 75 MG PO TBEC: 75.0000 mg | DELAYED_RELEASE_TABLET | Freq: Two times a day (BID) | ORAL | 5 refills | Status: DC

## 2019-07-16 NOTE — Patient Instructions (Signed)
It was very nice to see you today!  Keep up the good work!  No changes today.  Come back in 6 months to recheck your blood sugar, or sooner if needed.  Take care, Dr Jerline Pain  Please try these tips to maintain a healthy lifestyle:   Eat at least 3 REAL meals and 1-2 snacks per day.  Aim for no more than 5 hours between eating.  If you eat breakfast, please do so within one hour of getting up.    Each meal should contain half fruits/vegetables, one quarter protein, and one quarter carbs (no bigger than a computer mouse)   Cut down on sweet beverages. This includes juice, soda, and sweet tea.     Drink at least 1 glass of water with each meal and aim for at least 8 glasses per day   Exercise at least 150 minutes every week.

## 2019-07-16 NOTE — Progress Notes (Signed)
   Edward Burton is a 59 y.o. male who presents today for an office visit.  Assessment/Plan:  Chronic Problems Addressed Today: HTN (hypertension) Stable. Continue lisinopril 10mg  daily. Coreg 6.25mg  twice daily, and spironolactone 25mg  daily.   Hyperglycemia A1c 6.1 patient has been working on diet and exercise.  Congratulated patient on hard work.  We will continue Metformin 1000 mg twice daily and Ozempic 0.5 mg weekly.  Follow-up in 6 months to recheck A1c.  Hyperlipidemia Last LDL 99.  Continue lifestyle modifications.  Recheck lipid panel next blood draw.    Subjective:  HPI:  See A/p.        Objective:  Physical Exam: BP 130/78   Pulse 66   Temp 98.2 F (36.8 C)   Ht 5\' 11"  (1.803 m)   Wt (!) 304 lb 3.2 oz (138 kg)   SpO2 97%   BMI 42.43 kg/m   Wt Readings from Last 3 Encounters:  07/16/19 (!) 304 lb 3.2 oz (138 kg)  04/12/19 (!) 319 lb 6.4 oz (144.9 kg)  12/05/18 300 lb (136.1 kg)  Gen: No acute distress, resting comfortably CV: Regular rate and rhythm with no murmurs appreciated Pulm: Normal work of breathing, clear to auscultation bilaterally with no crackles, wheezes, or rhonchi Neuro: Grossly normal, moves all extremities Psych: Normal affect and thought content      Wadsworth Skolnick M. Jerline Pain, MD 07/16/2019 3:02 PM

## 2019-07-16 NOTE — Assessment & Plan Note (Signed)
A1c 6.1 patient has been working on diet and exercise.  Congratulated patient on hard work.  We will continue Metformin 1000 mg twice daily and Ozempic 0.5 mg weekly.  Follow-up in 6 months to recheck A1c.

## 2019-07-16 NOTE — Assessment & Plan Note (Signed)
Last LDL 99.  Continue lifestyle modifications.  Recheck lipid panel next blood draw.

## 2019-07-16 NOTE — Assessment & Plan Note (Signed)
Stable. Continue lisinopril 10mg  daily. Coreg 6.25mg  twice daily, and spironolactone 25mg  daily.

## 2019-09-02 ENCOUNTER — Telehealth: Payer: Self-pay

## 2019-09-02 NOTE — Telephone Encounter (Signed)
error 

## 2019-09-04 ENCOUNTER — Other Ambulatory Visit: Payer: Self-pay | Admitting: Family Medicine

## 2019-11-12 ENCOUNTER — Ambulatory Visit (INDEPENDENT_AMBULATORY_CARE_PROVIDER_SITE_OTHER): Payer: BLUE CROSS/BLUE SHIELD | Admitting: Family Medicine

## 2019-11-12 ENCOUNTER — Other Ambulatory Visit: Payer: Self-pay

## 2019-11-12 ENCOUNTER — Encounter: Payer: Self-pay | Admitting: Family Medicine

## 2019-11-12 VITALS — BP 135/78 | HR 81 | Temp 97.9°F | Ht 71.0 in | Wt 325.6 lb

## 2019-11-12 DIAGNOSIS — Z23 Encounter for immunization: Secondary | ICD-10-CM | POA: Diagnosis not present

## 2019-11-12 DIAGNOSIS — Z1211 Encounter for screening for malignant neoplasm of colon: Secondary | ICD-10-CM

## 2019-11-12 DIAGNOSIS — R739 Hyperglycemia, unspecified: Secondary | ICD-10-CM

## 2019-11-12 DIAGNOSIS — I5020 Unspecified systolic (congestive) heart failure: Secondary | ICD-10-CM

## 2019-11-12 DIAGNOSIS — E785 Hyperlipidemia, unspecified: Secondary | ICD-10-CM

## 2019-11-12 DIAGNOSIS — Z0001 Encounter for general adult medical examination with abnormal findings: Secondary | ICD-10-CM | POA: Diagnosis not present

## 2019-11-12 DIAGNOSIS — J449 Chronic obstructive pulmonary disease, unspecified: Secondary | ICD-10-CM | POA: Diagnosis not present

## 2019-11-12 DIAGNOSIS — I10 Essential (primary) hypertension: Secondary | ICD-10-CM

## 2019-11-12 DIAGNOSIS — N400 Enlarged prostate without lower urinary tract symptoms: Secondary | ICD-10-CM | POA: Insufficient documentation

## 2019-11-12 MED ORDER — GABAPENTIN 300 MG PO CAPS: 300.0000 mg | ORAL_CAPSULE | Freq: Two times a day (BID) | ORAL | 3 refills | Status: DC

## 2019-11-12 MED ORDER — TIZANIDINE HCL 4 MG PO TABS: ORAL_TABLET | ORAL | 0 refills | Status: DC

## 2019-11-12 MED ORDER — DICLOFENAC SODIUM 75 MG PO TBEC: 75.0000 mg | DELAYED_RELEASE_TABLET | Freq: Two times a day (BID) | ORAL | 5 refills | Status: DC

## 2019-11-12 MED ORDER — TAMSULOSIN HCL 0.4 MG PO CAPS
0.4000 mg | ORAL_CAPSULE | Freq: Every day | ORAL | 3 refills | Status: DC
Start: 2019-11-12 — End: 2020-07-25

## 2019-11-12 NOTE — Patient Instructions (Signed)
It was very nice to see you today!  You probably have an enlarged prostate. Please try the Flomax to help with your urinary issues.  We give your flu vaccine today and we will check blood work today.  I will place a referral for you to see the bariatric specialist.  Also place referral for you to get your colonoscopy. I will plan on seeing you back in 6 months depending on your blood work. Please come back to see me sooner if needed.  Take care, Dr Jerline Pain  Please try these tips to maintain a healthy lifestyle:   Eat at least 3 REAL meals and 1-2 snacks per day.  Aim for no more than 5 hours between eating.  If you eat breakfast, please do so within one hour of getting up.    Each meal should contain half fruits/vegetables, one quarter protein, and one quarter carbs (no bigger than a computer mouse)   Cut down on sweet beverages. This includes juice, soda, and sweet tea.     Drink at least 1 glass of water with each meal and aim for at least 8 glasses per day   Exercise at least 150 minutes every week.    Preventive Care 68-40 Years Old, Male Preventive care refers to lifestyle choices and visits with your health care provider that can promote health and wellness. This includes:  A yearly physical exam. This is also called an annual well check.  Regular dental and eye exams.  Immunizations.  Screening for certain conditions.  Healthy lifestyle choices, such as eating a healthy diet, getting regular exercise, not using drugs or products that contain nicotine and tobacco, and limiting alcohol use. What can I expect for my preventive care visit? Physical exam Your health care provider will check:  Height and weight. These may be used to calculate body mass index (BMI), which is a measurement that tells if you are at a healthy weight.  Heart rate and blood pressure.  Your skin for abnormal spots. Counseling Your health care provider may ask you questions  about:  Alcohol, tobacco, and drug use.  Emotional well-being.  Home and relationship well-being.  Sexual activity.  Eating habits.  Work and work Statistician. What immunizations do I need?  Influenza (flu) vaccine  This is recommended every year. Tetanus, diphtheria, and pertussis (Tdap) vaccine  You may need a Td booster every 10 years. Varicella (chickenpox) vaccine  You may need this vaccine if you have not already been vaccinated. Zoster (shingles) vaccine  You may need this after age 30. Measles, mumps, and rubella (MMR) vaccine  You may need at least one dose of MMR if you were born in 1957 or later. You may also need a second dose. Pneumococcal conjugate (PCV13) vaccine  You may need this if you have certain conditions and were not previously vaccinated. Pneumococcal polysaccharide (PPSV23) vaccine  You may need one or two doses if you smoke cigarettes or if you have certain conditions. Meningococcal conjugate (MenACWY) vaccine  You may need this if you have certain conditions. Hepatitis A vaccine  You may need this if you have certain conditions or if you travel or work in places where you may be exposed to hepatitis A. Hepatitis B vaccine  You may need this if you have certain conditions or if you travel or work in places where you may be exposed to hepatitis B. Haemophilus influenzae type b (Hib) vaccine  You may need this if you have certain risk factors.  Human papillomavirus (HPV) vaccine  If recommended by your health care provider, you may need three doses over 6 months. You may receive vaccines as individual doses or as more than one vaccine together in one shot (combination vaccines). Talk with your health care provider about the risks and benefits of combination vaccines. What tests do I need? Blood tests  Lipid and cholesterol levels. These may be checked every 5 years, or more frequently if you are over 24 years old.  Hepatitis C  test.  Hepatitis B test. Screening  Lung cancer screening. You may have this screening every year starting at age 63 if you have a 30-pack-year history of smoking and currently smoke or have quit within the past 15 years.  Prostate cancer screening. Recommendations will vary depending on your family history and other risks.  Colorectal cancer screening. All adults should have this screening starting at age 51 and continuing until age 28. Your health care provider may recommend screening at age 54 if you are at increased risk. You will have tests every 1-10 years, depending on your results and the type of screening test.  Diabetes screening. This is done by checking your blood sugar (glucose) after you have not eaten for a while (fasting). You may have this done every 1-3 years.  Sexually transmitted disease (STD) testing. Follow these instructions at home: Eating and drinking  Eat a diet that includes fresh fruits and vegetables, whole grains, lean protein, and low-fat dairy products.  Take vitamin and mineral supplements as recommended by your health care provider.  Do not drink alcohol if your health care provider tells you not to drink.  If you drink alcohol: ? Limit how much you have to 0-2 drinks a day. ? Be aware of how much alcohol is in your drink. In the [place] one drink equals one 12 oz bottle of beer (355 mL), one 5 oz glass of wine (148 mL), or one 1 oz glass of hard liquor (44 mL). Lifestyle  Take daily care of your teeth and gums.  Stay active. Exercise for at least 30 minutes on 5 or more days each week.  Do not use any products that contain nicotine or tobacco, such as cigarettes, e-cigarettes, and chewing tobacco. If you need help quitting, ask your health care provider.  If you are sexually active, practice safe sex. Use a condom or other form of protection to prevent STIs (sexually transmitted infections).  Talk with your health care provider about taking a  low-dose aspirin every day starting at age 49. What's next?  Go to your health care provider once a year for a well check visit.  Ask your health care provider how often you should have your eyes and teeth checked.  Stay up to date on all vaccines. This information is not intended to replace advice given to you by your health care provider. Make sure you discuss any questions you have with your health care provider. Document Revised: 12/18/2017 Document Reviewed: 12/18/2017 Elsevier Patient Education  2020 [employer].

## 2019-11-12 NOTE — Assessment & Plan Note (Signed)
Check A1c.  Continue Metformin 1000 mg twice daily and Ozempic 0.5 mg weekly.

## 2019-11-12 NOTE — Assessment & Plan Note (Signed)
Not well controlled.  Will try Flomax.  Discussed potential side effects.  Check UA today as well.

## 2019-11-12 NOTE — Assessment & Plan Note (Signed)
Check lipids.  Continue lifestyle modifications. 

## 2019-11-12 NOTE — Assessment & Plan Note (Signed)
At goal.  Continue lisinopril 10 mg daily, Coreg 6.25 mg twice daily, and spironolactone 25 mg daily.  Check CBC, CMET, TSH.

## 2019-11-12 NOTE — Assessment & Plan Note (Signed)
Stable.  No recent flares.  Continue Symbicort and rescue inhalers.

## 2019-11-12 NOTE — Assessment & Plan Note (Addendum)
On Ozempic.  Will place referral to bariatric surgery as he has not had much success with weight loss.

## 2019-11-12 NOTE — Assessment & Plan Note (Signed)
Stable.  Sees cardiology.  No signs of volume overload.  Continue spironolactone, Lasix, lisinopril, and Coreg.
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
## 2019-11-13 LAB — COMPREHENSIVE METABOLIC PANEL
AG Ratio: 1.2 (calc) (ref 1.0–2.5)
ALT: 18 U/L (ref 9–46)
AST: 16 U/L (ref 10–35)
Albumin: 4.1 g/dL (ref 3.6–5.1)
Alkaline phosphatase (APISO): 57 U/L (ref 35–144)
BUN: 14 mg/dL (ref 7–25)
CO2: 31 mmol/L (ref 20–32)
Calcium: 9.1 mg/dL (ref 8.6–10.3)
Chloride: 104 mmol/L (ref 98–110)
Creat: 0.9 mg/dL (ref 0.70–1.33)
Globulin: 3.4 g/dL (calc) (ref 1.9–3.7)
Glucose, Bld: 161 mg/dL — ABNORMAL HIGH (ref 65–99)
Potassium: 4 mmol/L (ref 3.5–5.3)
Sodium: 140 mmol/L (ref 135–146)
Total Bilirubin: 0.4 mg/dL (ref 0.2–1.2)
Total Protein: 7.5 g/dL (ref 6.1–8.1)

## 2019-11-13 LAB — CBC
HCT: 38.8 % (ref 38.5–50.0)
Hemoglobin: 12.4 g/dL — ABNORMAL LOW (ref 13.2–17.1)
MCH: 27.2 pg (ref 27.0–33.0)
MCHC: 32 g/dL (ref 32.0–36.0)
MCV: 85.1 fL (ref 80.0–100.0)
MPV: 11.2 fL (ref 7.5–12.5)
Platelets: 214 10*3/uL (ref 140–400)
RBC: 4.56 10*6/uL (ref 4.20–5.80)
RDW: 12.8 % (ref 11.0–15.0)
WBC: 7.4 10*3/uL (ref 3.8–10.8)

## 2019-11-13 LAB — LIPID PANEL
Cholesterol: 181 mg/dL (ref <200)
HDL: 45 mg/dL (ref >=40)
LDL Cholesterol (Calc): 114 mg/dL (calc) — ABNORMAL HIGH
Non-HDL Cholesterol (Calc): 136 mg/dL (calc) — ABNORMAL HIGH (ref <130)
Total CHOL/HDL Ratio: 4 (calc) (ref <5.0)
Triglycerides: 108 mg/dL (ref <150)

## 2019-11-13 LAB — HEMOGLOBIN A1C
Hgb A1c MFr Bld: 6.9 % of total Hgb — ABNORMAL HIGH (ref <5.7)
Mean Plasma Glucose: 151 (calc)
eAG (mmol/L): 8.4 (calc)

## 2019-11-13 LAB — URINALYSIS, ROUTINE W REFLEX MICROSCOPIC
Bilirubin Urine: NEGATIVE
Glucose, UA: NEGATIVE
Hgb urine dipstick: NEGATIVE
Ketones, ur: NEGATIVE
Leukocytes,Ua: NEGATIVE
Nitrite: NEGATIVE
Protein, ur: NEGATIVE
Specific Gravity, Urine: 1.03 (ref 1.001–1.03)
pH: 5.5 (ref 5.0–8.0)

## 2019-11-13 LAB — TSH: TSH: 4.2 mIU/L (ref 0.40–4.50)

## 2019-11-13 LAB — PSA: PSA: 0.6 ng/mL (ref <=4.0)

## 2019-11-15 ENCOUNTER — Telehealth: Payer: Self-pay

## 2019-11-15 NOTE — Telephone Encounter (Signed)
Result note is in.  Algis Greenhouse. Jerline Pain, MD 11/15/2019 2:42 PM

## 2019-11-15 NOTE — Telephone Encounter (Signed)
Pt is requesting someone call him and go over his recent labs with him.

## 2019-11-15 NOTE — Telephone Encounter (Signed)
Called and spoke with pt and labs reviewed. 

## 2019-11-15 NOTE — Progress Notes (Signed)
Please inform patient of the following:  Blood sugar and cholesterol are up a bit but still within goal.  Do not need to make any changes to his treatment plan at this time.  Recommend that he continue with diet and exercise and we can recheck in a year.  Would like to recheck his blood sugar in 6 months.  Edward [place]. Jerline Pain, MD 11/15/2019 2:28 PM

## 2019-11-15 NOTE — Telephone Encounter (Signed)
There are no comments listed on pt labs for me to review with him.

## 2020-01-22 ENCOUNTER — Encounter: Payer: Self-pay | Admitting: Primary Care

## 2020-01-22 ENCOUNTER — Telehealth (INDEPENDENT_AMBULATORY_CARE_PROVIDER_SITE_OTHER): Payer: 59 | Admitting: Primary Care

## 2020-01-22 ENCOUNTER — Other Ambulatory Visit: Payer: Self-pay

## 2020-01-22 VITALS — Ht 71.0 in | Wt 325.0 lb

## 2020-01-22 DIAGNOSIS — J3489 Other specified disorders of nose and nasal sinuses: Secondary | ICD-10-CM | POA: Diagnosis not present

## 2020-01-22 DIAGNOSIS — R0982 Postnasal drip: Secondary | ICD-10-CM

## 2020-01-22 MED ORDER — CETIRIZINE HCL 10 MG PO TABS: 10.0000 mg | ORAL_TABLET | Freq: Every day | ORAL | 0 refills | Status: DC

## 2020-01-22 NOTE — Assessment & Plan Note (Signed)
Acute viral vs allergy symptoms x 3-4 days. Doesn't appear sickly, he is driving during our visit. Recommended he use his Atrovent nasal spray daily for 5 days, add in Zyrtec 10 mg, stop Benadryl. Discussed use of Tylenol for sore throat. Await Covid-19 test.  He will update PCP if no improvement in symptoms after 7 days of symptom onset.
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
## 2020-01-22 NOTE — Patient Instructions (Signed)
Stop taking Benadryl. Start taking Zyrtec 10 mg daily for allergies and drainage.  Use your ipratropium nasal spray twice daily for 5 days as discussed.  Call your doctor on Tuesday next week if your symptoms are worse or have not improved.   It was a pleasure meeting you! Allie Bossier, NP-C

## 2020-01-30 ENCOUNTER — Emergency Department (HOSPITAL_COMMUNITY)
Admission: EM | Admit: 2020-01-30 | Discharge: 2020-01-31 | Disposition: A | Discharge status: Home / Self Care | Payer: 59 | Attending: Emergency Medicine | Admitting: Emergency Medicine

## 2020-01-30 ENCOUNTER — Encounter (HOSPITAL_COMMUNITY): Payer: Self-pay

## 2020-01-30 ENCOUNTER — Emergency Department (HOSPITAL_COMMUNITY): Payer: 59

## 2020-01-30 ENCOUNTER — Other Ambulatory Visit: Payer: Self-pay

## 2020-01-30 DIAGNOSIS — J45909 Unspecified asthma, uncomplicated: Secondary | ICD-10-CM | POA: Insufficient documentation

## 2020-01-30 DIAGNOSIS — I5020 Unspecified systolic (congestive) heart failure: Secondary | ICD-10-CM | POA: Diagnosis not present

## 2020-01-30 DIAGNOSIS — R071 Chest pain on breathing: Secondary | ICD-10-CM | POA: Diagnosis present

## 2020-01-30 DIAGNOSIS — Z7984 Long term (current) use of oral hypoglycemic drugs: Secondary | ICD-10-CM | POA: Insufficient documentation

## 2020-01-30 DIAGNOSIS — J449 Chronic obstructive pulmonary disease, unspecified: Secondary | ICD-10-CM | POA: Diagnosis not present

## 2020-01-30 DIAGNOSIS — Z7982 Long term (current) use of aspirin: Secondary | ICD-10-CM | POA: Diagnosis not present

## 2020-01-30 DIAGNOSIS — E1165 Type 2 diabetes mellitus with hyperglycemia: Secondary | ICD-10-CM | POA: Diagnosis not present

## 2020-01-30 DIAGNOSIS — Z79899 Other long term (current) drug therapy: Secondary | ICD-10-CM | POA: Diagnosis not present

## 2020-01-30 DIAGNOSIS — I110 Hypertensive heart disease with heart failure: Secondary | ICD-10-CM | POA: Diagnosis not present

## 2020-01-30 DIAGNOSIS — Z794 Long term (current) use of insulin: Secondary | ICD-10-CM | POA: Insufficient documentation

## 2020-01-30 DIAGNOSIS — Z87891 Personal history of nicotine dependence: Secondary | ICD-10-CM | POA: Diagnosis not present

## 2020-01-30 DIAGNOSIS — R0600 Dyspnea, unspecified: Secondary | ICD-10-CM | POA: Diagnosis not present

## 2020-01-30 DIAGNOSIS — Z7951 Long term (current) use of inhaled steroids: Secondary | ICD-10-CM | POA: Insufficient documentation

## 2020-01-30 DIAGNOSIS — R0789 Other chest pain: Secondary | ICD-10-CM | POA: Insufficient documentation

## 2020-01-30 DIAGNOSIS — R110 Nausea: Secondary | ICD-10-CM | POA: Diagnosis not present

## 2020-01-30 DIAGNOSIS — I10 Essential (primary) hypertension: Secondary | ICD-10-CM

## 2020-01-30 LAB — CBC
HCT: 42.2 % (ref 39.0–52.0)
Hemoglobin: 12.9 g/dL — ABNORMAL LOW (ref 13.0–17.0)
MCH: 27.3 pg (ref 26.0–34.0)
MCHC: 30.6 g/dL (ref 30.0–36.0)
MCV: 89.2 fL (ref 80.0–100.0)
Platelets: 207 10*3/uL (ref 150–400)
RBC: 4.73 MIL/uL (ref 4.22–5.81)
RDW: 13.8 % (ref 11.5–15.5)
WBC: 8 10*3/uL (ref 4.0–10.5)
nRBC: 0 % (ref 0.0–0.2)

## 2020-01-30 LAB — BASIC METABOLIC PANEL
Anion gap: 10 (ref 5–15)
BUN: 11 mg/dL (ref 6–20)
CO2: 29 mmol/L (ref 22–32)
Calcium: 9.1 mg/dL (ref 8.9–10.3)
Chloride: 101 mmol/L (ref 98–111)
Creatinine, Ser: 0.88 mg/dL (ref 0.61–1.24)
GFR, Estimated: >60 mL/min (ref >60)
Glucose, Bld: 112 mg/dL — ABNORMAL HIGH (ref 70–99)
Potassium: 3.8 mmol/L (ref 3.5–5.1)
Sodium: 140 mmol/L (ref 135–145)

## 2020-01-30 LAB — TROPONIN I (HIGH SENSITIVITY): Troponin I (High Sensitivity): 9 ng/L (ref <18)

## 2020-01-30 NOTE — ED Triage Notes (Signed)
Pt to er, pt states that the day before yesterday he started having some chest pain when he was cleaning the snow off his driveway.  Pt states that it is a constant pain.  States that nothing seems to make it better or worse.

## 2020-01-31 DIAGNOSIS — R0789 Other chest pain: Secondary | ICD-10-CM | POA: Diagnosis not present

## 2020-01-31 LAB — TROPONIN I (HIGH SENSITIVITY): Troponin I (High Sensitivity): 8 ng/L (ref <18)

## 2020-01-31 MED ORDER — IBUPROFEN 400 MG PO TABS
400.0000 mg | ORAL_TABLET | Freq: Once | ORAL | Status: AC
  Administered 2020-01-31: 400 mg via ORAL
  Filled 2020-01-31: qty 1

## 2020-01-31 MED ORDER — TRAMADOL HCL 50 MG PO TABS: 50.0000 mg | ORAL_TABLET | Freq: Four times a day (QID) | ORAL | 0 refills | Status: DC | PRN

## 2020-01-31 MED ORDER — HYDROCODONE-ACETAMINOPHEN 5-325 MG PO TABS
1.0000 | ORAL_TABLET | Freq: Once | ORAL | Status: AC
  Administered 2020-01-31: 1 via ORAL
  Filled 2020-01-31: qty 1

## 2020-01-31 MED ORDER — NAPROXEN 500 MG PO TABS: 500.0000 mg | ORAL_TABLET | Freq: Two times a day (BID) | ORAL | 0 refills | Status: DC

## 2020-01-31 NOTE — Discharge Instructions (Signed)
Apply ice to the sore area for 30 minutes at a time, 4 times a day.  Call your cardiologist for a follow-up appointment in the next week, if possible.  Return to the emergency department if your pain is getting worse.
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
## 2020-02-22 ENCOUNTER — Other Ambulatory Visit: Payer: Self-pay | Admitting: Primary Care

## 2020-02-22 DIAGNOSIS — R0982 Postnasal drip: Secondary | ICD-10-CM

## 2020-02-22 NOTE — Telephone Encounter (Signed)
Patient was given script at virtual visit with Anda Kraft last month. Do you want him to continue on it?

## 2020-03-08 ENCOUNTER — Other Ambulatory Visit: Payer: Self-pay | Admitting: Family Medicine

## 2020-03-08 DIAGNOSIS — R0982 Postnasal drip: Secondary | ICD-10-CM

## 2020-03-28 ENCOUNTER — Telehealth: Payer: Self-pay

## 2020-03-28 ENCOUNTER — Other Ambulatory Visit: Payer: Self-pay | Admitting: Family Medicine

## 2020-03-28 DIAGNOSIS — J449 Chronic obstructive pulmonary disease, unspecified: Secondary | ICD-10-CM

## 2020-03-28 DIAGNOSIS — R0982 Postnasal drip: Secondary | ICD-10-CM

## 2020-03-28 MED ORDER — FUROSEMIDE 40 MG PO TABS: 40.0000 mg | ORAL_TABLET | Freq: Every day | ORAL | 2 refills | Status: DC

## 2020-03-28 MED ORDER — IPRATROPIUM BROMIDE 0.03 % NA SOLN: NASAL | 1 refills | Status: DC

## 2020-03-28 NOTE — Telephone Encounter (Signed)
  LAST APPOINTMENT DATE: 03/28/2020   NEXT APPOINTMENT DATE:@5 /06/2020  MEDICATION:ipratropium (ATROVENT) 0.03 % nasal spray    furosemide (LASIX) 40 MG tablet   PHARMACY:[id number] - [place], [place] - [address]    CLINICAL FILLS OUT ALL BELOW:   LAST REFILL:  QTY:  REFILL DATE:    OTHER COMMENTS:    Okay for refill?  Please advise

## 2020-03-28 NOTE — Telephone Encounter (Signed)
Refills sent

## 2020-04-11 ENCOUNTER — Other Ambulatory Visit: Payer: Self-pay | Admitting: Family Medicine

## 2020-04-11 NOTE — Telephone Encounter (Signed)
Please advise, has not been refilled since 2020. Okay to refill or does patient need an appointment?

## 2020-04-16 IMAGING — CR PORTABLE CHEST - 1 VIEW
1 series · 1 of 1 positions shown · non-contrast
Comparison: Radiographs November 07, 2017.

CLINICAL DATA: Shortness of breath.  Productive cough.

EXAM:
PORTABLE CHEST 1 VIEW

[portable]
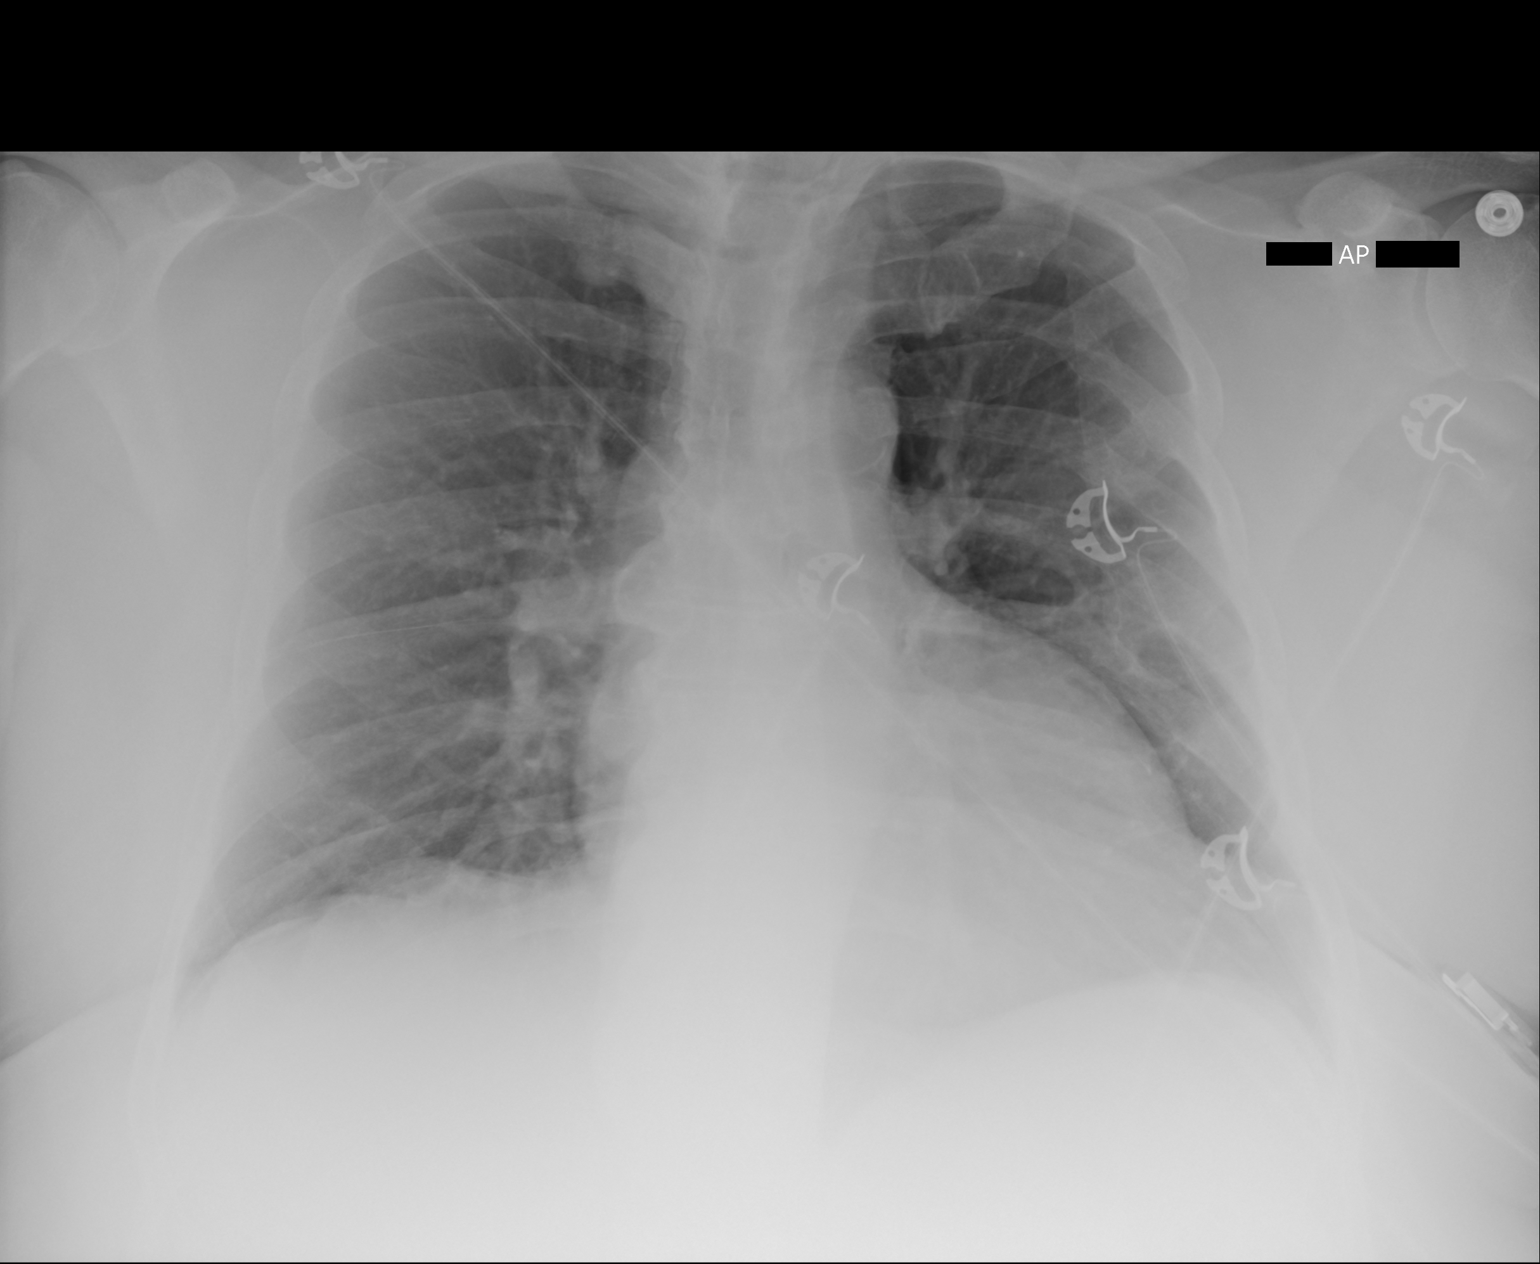

[1 of 1 positions shown; findings below may reference images not displayed]

FINDINGS: Stable cardiomediastinal silhouette. No pneumothorax or pleural
effusion is noted. Both lungs are clear. Old left rib fractures are
noted.
IMPRESSION: No active disease.

## 2020-05-12 ENCOUNTER — Ambulatory Visit: Payer: Medicare Other | Admitting: Family Medicine

## 2020-07-20 ENCOUNTER — Other Ambulatory Visit: Payer: Self-pay | Admitting: Family Medicine

## 2020-07-25 ENCOUNTER — Other Ambulatory Visit: Payer: Self-pay

## 2020-07-25 ENCOUNTER — Telehealth (INDEPENDENT_AMBULATORY_CARE_PROVIDER_SITE_OTHER): Payer: Medicare Other | Admitting: Family Medicine

## 2020-07-25 VITALS — Ht 71.0 in | Wt 300.0 lb

## 2020-07-25 DIAGNOSIS — J449 Chronic obstructive pulmonary disease, unspecified: Secondary | ICD-10-CM | POA: Diagnosis not present

## 2020-07-25 DIAGNOSIS — N400 Enlarged prostate without lower urinary tract symptoms: Secondary | ICD-10-CM

## 2020-07-25 MED ORDER — PREDNISONE 50 MG PO TABS: ORAL_TABLET | ORAL | 0 refills | Status: DC

## 2020-07-25 MED ORDER — NIRMATRELVIR/RITONAVIR (PAXLOVID)TABLET: 3.0000 | ORAL_TABLET | Freq: Two times a day (BID) | ORAL | 0 refills | Status: AC

## 2020-07-25 MED ORDER — TAMSULOSIN HCL 0.4 MG PO CAPS: 0.4000 mg | ORAL_CAPSULE | Freq: Every day | ORAL | 3 refills | Status: DC

## 2020-07-25 NOTE — Progress Notes (Signed)
   Edward Burton is a 60 y.o. male who presents today for a virtual office visit.  Assessment/Plan:  Chronic Problems Addressed Today: BPH (benign prostatic hyperplasia) Restart Flomax.  This was prescribed previously but he never tried it.  COPD (chronic obstructive pulmonary disease) (HCC) Acute flare likely due to COVID-19.  We will start prednisone.  He will continue home inhalers.  Also start paxlovid as he is within the window for treatment and has no obvious contraindications.  Currently appears well with no signs of respiratory distress.  Discussed reasons to return to care.     Subjective:  HPI:  Patient here with concern for COVID exposure.  Symptoms started a couple days ago include sore throat nasal congestion cough.  Has had several family work sick with similar symptoms.  No specific treatments tried.  Would like he had a flareup of COPD.  Some wheezing.  No chest pain.       Objective/Observations  Physical Exam: Gen: NAD, resting comfortably Pulm: Normal work of breathing Neuro: Grossly normal, moves all extremities Psych: Normal affect and thought content  Virtual Visit via Video   I connected with Edward Burton on 07/25/20 at  1:00 PM EDT by a video enabled telemedicine application and verified that I am speaking with the correct person using two identifiers. The limitations of evaluation and management by telemedicine and the availability of in person appointments were discussed. The patient expressed understanding and agreed to proceed.   Patient location: Home Provider location: [place] participating in the virtual visit: Myself and Patient     Algis Greenhouse. Jerline Pain, MD 07/25/2020 1:20 PM

## 2020-07-25 NOTE — Assessment & Plan Note (Signed)
Restart Flomax.  This was prescribed previously but he never tried it.

## 2020-07-25 NOTE — Assessment & Plan Note (Signed)
Acute flare likely due to COVID-19.  We will start prednisone.  He will continue home inhalers.  Also start paxlovid as he is within the window for treatment and has no obvious contraindications.  Currently appears well with no signs of respiratory distress.  Discussed reasons to return to care.

## 2020-08-10 ENCOUNTER — Telehealth: Payer: Self-pay

## 2020-08-10 MED ORDER — OZEMPIC (0.25 OR 0.5 MG/DOSE) 2 MG/1.5ML ~~LOC~~ SOPN: 0.5000 mg | PEN_INJECTOR | SUBCUTANEOUS | 0 refills | Status: DC

## 2020-08-10 NOTE — Telephone Encounter (Signed)
Rx sent to pharmacy   

## 2020-08-10 NOTE — Telephone Encounter (Signed)
LAST APPOINTMENT DATE: 07/25/20                        NEXT APPOINTMENT DATE:None  MEDICATION:Semaglutide,0.25 or 0.'5MG'$ /DOS, (OZEMPIC, 0.25 OR 0.5 MG/DOSE,) 2 MG/1.5ML SOPN  [facility]ACY[id number] [place], [place] - [address]

## 2020-09-19 ENCOUNTER — Telehealth: Payer: Self-pay

## 2020-09-19 MED ORDER — OZEMPIC (0.25 OR 0.5 MG/DOSE) 2 MG/1.5ML ~~LOC~~ SOPN: 0.5000 mg | PEN_INJECTOR | SUBCUTANEOUS | 2 refills | Status: DC

## 2020-09-19 NOTE — Telephone Encounter (Signed)
LAST APPOINTMENT DATE:  07/25/20  NEXT APPOINTMENT DATE: 11/24/20  MEDICATION:Semaglutide,0.25 or 0.'5MG'$ /DOS, (OZEMPIC, 0.25 OR 0.5 MG/DOSE,) 2 MG/1.5ML SOPN  [id number] [place], [place] - [address]  **Pt requested prescription last month but never heard from the pharmacy so he did not receive it. Please Advise.

## 2020-09-19 NOTE — Telephone Encounter (Signed)
Refill sent to below pharmacy.

## 2020-09-30 ENCOUNTER — Ambulatory Visit (INDEPENDENT_AMBULATORY_CARE_PROVIDER_SITE_OTHER): Payer: 59 | Admitting: Cardiology

## 2020-09-30 DIAGNOSIS — Z0000 Encounter for general adult medical examination without abnormal findings: Secondary | ICD-10-CM | POA: Diagnosis not present

## 2020-09-30 NOTE — Patient Instructions (Signed)
Health Maintenance, Male Adopting a healthy lifestyle and getting preventive care are important in promoting health and wellness. Ask your health care provider about: The right schedule for you to have regular tests and exams. Things you can do on your own to prevent diseases and keep yourself healthy. What should I know about diet, weight, and exercise? Eat a healthy diet  Eat a diet that includes plenty of vegetables, fruits, low-fat dairy products, and lean protein. Do not eat a lot of foods that are high in solid fats, added sugars, or sodium. Maintain a healthy weight Body mass index (BMI) is a measurement that can be used to identify possible weight problems. It estimates body fat based on height and weight. Your health care provider can help determine your BMI and help you achieve or maintain a healthy weight. Get regular exercise Get regular exercise. This is one of the most important things you can do for your health. Most adults should: Exercise for at least 150 minutes each week. The exercise should increase your heart rate and make you sweat (moderate-intensity exercise). Do strengthening exercises at least twice a week. This is in addition to the moderate-intensity exercise. Spend less time sitting. Even light physical activity can be beneficial. Watch cholesterol and blood lipids Have your blood tested for lipids and cholesterol at 59 years of age, then have this test every 5 years. You may need to have your cholesterol levels checked more often if: Your lipid or cholesterol levels are high. You are older than 59 years of age. You are at high risk for heart disease. What should I know about cancer screening? Many types of cancers can be detected early and may often be prevented. Depending on your health history and family history, you may need to have cancer screening at various ages. This may include screening for: Colorectal cancer. Prostate cancer. Skin cancer. Lung  cancer. What should I know about heart disease, diabetes, and high blood pressure? Blood pressure and heart disease High blood pressure causes heart disease and increases the risk of stroke. This is more likely to develop in people who have high blood pressure readings, are of African descent, or are overweight. Talk with your health care provider about your target blood pressure readings. Have your blood pressure checked: Every 3-5 years if you are 18-39 years of age. Every year if you are 40 years old or older. If you are between the ages of 65 and 75 and are a current or former smoker, ask your health care provider if you should have a one-time screening for abdominal aortic aneurysm (AAA). Diabetes Have regular diabetes screenings. This checks your fasting blood sugar level. Have the screening done: Once every three years after age 45 if you are at a normal weight and have a low risk for diabetes. More often and at a younger age if you are overweight or have a high risk for diabetes. What should I know about preventing infection? Hepatitis B If you have a higher risk for hepatitis B, you should be screened for this virus. Talk with your health care provider to find out if you are at risk for hepatitis B infection. Hepatitis C Blood testing is recommended for: Everyone born from 1945 through 1965. Anyone with known risk factors for hepatitis C. Sexually transmitted infections (STIs) You should be screened each year for STIs, including gonorrhea and chlamydia, if: You are sexually active and are younger than 59 years of age. You are older than 59 years   of age and your health care provider tells you that you are at risk for this type of infection. Your sexual activity has changed since you were last screened, and you are at increased risk for chlamydia or gonorrhea. Ask your health care provider if you are at risk. Ask your health care provider about whether you are at high risk for HIV.  Your health care provider may recommend a prescription medicine to help prevent HIV infection. If you choose to take medicine to prevent HIV, you should first get tested for HIV. You should then be tested every 3 months for as long as you are taking the medicine. Follow these instructions at home: Lifestyle Do not use any products that contain nicotine or tobacco, such as cigarettes, e-cigarettes, and chewing tobacco. If you need help quitting, ask your health care provider. Do not use street drugs. Do not share needles. Ask your health care provider for help if you need support or information about quitting drugs. Alcohol use Do not drink alcohol if your health care provider tells you not to drink. If you drink alcohol: Limit how much you have to 0-2 drinks a day. Be aware of how much alcohol is in your drink. In the [place] one drink equals one 12 oz bottle of beer (355 mL), one 5 oz glass of wine (148 mL), or one 1 oz glass of hard liquor (44 mL). General instructions Schedule regular health, dental, and eye exams. Stay current with your vaccines. Tell your health care provider if: You often feel depressed. You have ever been abused or do not feel safe at home. Summary Adopting a healthy lifestyle and getting preventive care are important in promoting health and wellness. Follow your health care provider's instructions about healthy diet, exercising, and getting tested or screened for diseases. Follow your health care provider's instructions on monitoring your cholesterol and blood pressure. This information is not intended to replace advice given to you by your health care provider. Make sure you discuss any questions you have with your health care provider. Document Revised: 03/03/2020 Document Reviewed: 12/17/2017 [employer] Patient Education  2022 [employer]  
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
## 2020-11-03 ENCOUNTER — Other Ambulatory Visit: Payer: Self-pay | Admitting: Family Medicine

## 2020-11-06 ENCOUNTER — Other Ambulatory Visit: Payer: Self-pay | Admitting: *Deleted

## 2020-11-07 MED ORDER — GABAPENTIN 300 MG PO CAPS: 300.0000 mg | ORAL_CAPSULE | Freq: Two times a day (BID) | ORAL | 1 refills | Status: DC

## 2020-11-24 ENCOUNTER — Ambulatory Visit (INDEPENDENT_AMBULATORY_CARE_PROVIDER_SITE_OTHER): Payer: Medicare Other | Admitting: Family Medicine

## 2020-11-24 ENCOUNTER — Other Ambulatory Visit: Payer: Self-pay

## 2020-11-24 VITALS — BP 153/83 | HR 80 | Temp 97.7°F | Ht 71.0 in | Wt 326.2 lb

## 2020-11-24 DIAGNOSIS — I10 Essential (primary) hypertension: Secondary | ICD-10-CM | POA: Diagnosis not present

## 2020-11-24 DIAGNOSIS — R739 Hyperglycemia, unspecified: Secondary | ICD-10-CM | POA: Diagnosis not present

## 2020-11-24 DIAGNOSIS — J449 Chronic obstructive pulmonary disease, unspecified: Secondary | ICD-10-CM

## 2020-11-24 DIAGNOSIS — Z125 Encounter for screening for malignant neoplasm of prostate: Secondary | ICD-10-CM

## 2020-11-24 DIAGNOSIS — Z23 Encounter for immunization: Secondary | ICD-10-CM

## 2020-11-24 DIAGNOSIS — Z1211 Encounter for screening for malignant neoplasm of colon: Secondary | ICD-10-CM

## 2020-11-24 DIAGNOSIS — Z0001 Encounter for general adult medical examination with abnormal findings: Secondary | ICD-10-CM | POA: Diagnosis not present

## 2020-11-24 DIAGNOSIS — E785 Hyperlipidemia, unspecified: Secondary | ICD-10-CM | POA: Diagnosis not present

## 2020-11-24 DIAGNOSIS — I5020 Unspecified systolic (congestive) heart failure: Secondary | ICD-10-CM

## 2020-11-24 LAB — COMPREHENSIVE METABOLIC PANEL
ALT: 29 U/L (ref 0–53)
AST: 26 U/L (ref 0–37)
Albumin: 4 g/dL (ref 3.5–5.2)
Alkaline Phosphatase: 65 U/L (ref 39–117)
BUN: 11 mg/dL (ref 6–23)
CO2: 29 mEq/L (ref 19–32)
Calcium: 8.9 mg/dL (ref 8.4–10.5)
Chloride: 98 mEq/L (ref 96–112)
Creatinine, Ser: 0.88 mg/dL (ref 0.40–1.50)
GFR: 94.1 mL/min (ref >60.00)
Glucose, Bld: 149 mg/dL — ABNORMAL HIGH (ref 70–99)
Potassium: 3.8 mEq/L (ref 3.5–5.1)
Sodium: 136 mEq/L (ref 135–145)
Total Bilirubin: 0.6 mg/dL (ref 0.2–1.2)
Total Protein: 7.5 g/dL (ref 6.0–8.3)

## 2020-11-24 LAB — HEMOGLOBIN A1C: Hgb A1c MFr Bld: 9.4 % — ABNORMAL HIGH (ref 4.6–6.5)

## 2020-11-24 LAB — CBC
HCT: 38.7 % — ABNORMAL LOW (ref 39.0–52.0)
Hemoglobin: 12.4 g/dL — ABNORMAL LOW (ref 13.0–17.0)
MCHC: 32 g/dL (ref 30.0–36.0)
MCV: 84.5 fl (ref 78.0–100.0)
Platelets: 200 10*3/uL (ref 150.0–400.0)
RBC: 4.58 Mil/uL (ref 4.22–5.81)
RDW: 13.8 % (ref 11.5–15.5)
WBC: 6.2 10*3/uL (ref 4.0–10.5)

## 2020-11-24 LAB — PSA: PSA: 0.57 ng/mL (ref 0.10–4.00)

## 2020-11-24 LAB — LIPID PANEL
Cholesterol: 183 mg/dL (ref 0–200)
HDL: 36.7 mg/dL — ABNORMAL LOW (ref >39.00)
LDL Cholesterol: 127 mg/dL — ABNORMAL HIGH (ref 0–99)
NonHDL: 146.5
Total CHOL/HDL Ratio: 5
Triglycerides: 96 mg/dL (ref 0.0–149.0)
VLDL: 19.2 mg/dL (ref 0.0–40.0)

## 2020-11-24 LAB — TSH: TSH: 2.57 u[IU]/mL (ref 0.35–5.50)

## 2020-11-24 NOTE — Assessment & Plan Note (Signed)
Check A1c.  He is on metformin 1000 mg twice daily and Ozempic.  He is not sure of the dose of Ozempic but he thinks it is 0.25 mg weekly.  Instructed him to increase to 0.5 mg weekly.

## 2020-11-24 NOTE — Assessment & Plan Note (Signed)
Weight is stable.  He is on Ozempic.  He has not interested in bariatric surgery at this time.  Discussed lifestyle modifications.  We will likely try to titrate dose of Ozempic to 1 to 2 mg as tolerated.
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
## 2020-11-24 NOTE — Assessment & Plan Note (Signed)
Slightly above goal though typically well controlled.  Continue current regimen lisinopril 10 mg daily, Coreg 6.25 mg twice daily, and spironolactone 25 mg daily.  Check labs today.

## 2020-11-24 NOTE — Assessment & Plan Note (Addendum)
Euvolemic.  Sees cardiologist.  We will continue current regimen of spironolactone 25 mg daily, Lasix 40 mg daily, lisinopril 10mg  daily, and Coreg 6.25 mg twice daily per cardiology.

## 2020-11-24 NOTE — Assessment & Plan Note (Signed)
Check labs 
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
## 2020-11-24 NOTE — Assessment & Plan Note (Signed)
Overall stable.  Continue current regimen with Symbicort daily and albuterol as needed.

## 2020-11-28 NOTE — Progress Notes (Signed)
Please inform patient of the following:  His A1c is very elevated. Everything else stable. We instructed patient to increase his dose of ozempic to 0.5mg  weekly at his office visit. I would like for him to do this for 4 weeks and then send [place] a message. Ideally we should get him to 1mg  weekly in about a month.  He needs to come back in 3 months to recheck A1c.  Algis Greenhouse. Jerline Pain, MD 11/28/2020 12:17 PM

## 2020-11-29 ENCOUNTER — Telehealth: Payer: Self-pay

## 2020-11-29 ENCOUNTER — Other Ambulatory Visit: Payer: Self-pay | Admitting: *Deleted

## 2020-11-29 DIAGNOSIS — R739 Hyperglycemia, unspecified: Secondary | ICD-10-CM

## 2020-11-29 NOTE — Telephone Encounter (Signed)
Returned call to patient, gave lab results and recommendations. Patient verbalized understanding. Lab order placed. Lab visit scheduled.

## 2020-11-29 NOTE — Telephone Encounter (Signed)
Pt called about labs. Please Advise.

## 2020-12-05 ENCOUNTER — Telehealth: Payer: Self-pay | Admitting: *Deleted

## 2020-12-05 NOTE — Telephone Encounter (Signed)
Your PA request has been approved from 12/05/2020 to 12/05/2023 [facility] notified

## 2020-12-05 NOTE — Telephone Encounter (Signed)
PA  (Key: [id number]) Rx #: [id number] Ozempic (0.25 or 0.5 MG/DOSE) 2MG /1.5ML pen-injectors Waiting for determination

## 2021-01-02 ENCOUNTER — Other Ambulatory Visit: Payer: Self-pay

## 2021-01-09 ENCOUNTER — Telehealth: Payer: Self-pay

## 2021-01-09 NOTE — Telephone Encounter (Signed)
Left patient vm to call back to schedule if needed.   Patient Name: Edward Burton Gender: Male DOB: 1960/12/14 Age: 61 Y 24 M 27 D Return Phone Number: [phone] (Primary) Address: City/ State/ Zip: [place] [address] Client [facility] [place] Client Site [place] [facility] [place] [facility] Provider Dimas Chyle- MD Contact Type Call Who Is Calling Patient / Member / Family / Caregiver Call Type Triage / Clinical Caller Name Dieter Hane Return Phone Number [phone] (Primary) Chief Complaint Sore Throat Reason for Call Symptomatic / Request for Health Information Initial Comment (2/2)Caller states he and his wife tested positive for Covid and he is calling for medicine because they have sore throats. Nurse Assessment Nurse: Laqueta Due, RN, Metallurgist (Eastern Time): 01/08/2021 4:27:34 PM Confirm and document reason for call. If symptomatic, describe symptoms. ---Pt has covid and sore throat. Does the patient have any new or worsening symptoms? ---Yes Will a triage be completed? ---Yes Related visit to physician within the last 2 weeks? ---No Does the PT have any chronic conditions? (i.e. diabetes, asthma, this includes High risk factors for pregnancy, etc.) ---Yes List chronic conditions. ---HTN, diabetes Is this a behavioral health or substance abuse call? ---No Nurse: Laqueta Due, RN, Metallurgist (Eastern Time): 01/08/2021 4:39:06 PM Please select the assessment type ---Verbal order / New medication order Does the client directives allow for assistance with medications after hours? ---No Guidelines Guideline Title Affirmed Question Affirmed Notes Nurse Date/Time Eilene Ghazi Time) COVID-19 - Diagnosed or Suspected MILD difficulty breathing (e.g., minimal/no SOB [place], RN, Museum/gallery conservator 01/08/2021 4:31:15 PM Guidelines Guideline Title Affirmed Question Affirmed Notes Nurse Date/Time (Eastern Time) at rest,  SOB with walking, pulse <100) Disp. Time Eilene Ghazi Time) Disposition Final User 01/08/2021 12:10:08 PM Send To Nurse Assign Benny Lennert 01/08/2021 4:38:40 PM See HCP within 4 Hours (or PCP triage) Yes Laqueta Due, RN, Agricultural consultant Disagree/Comply Disagree Caller Understands Yes PreDisposition Did not know what to do Care Advice Given Per Guideline SEE HCP (OR PCP TRIAGE) WITHIN 4 HOURS: * IF OFFICE WILL BE CLOSED AND NO PCP (PRIMARY CARE PROVIDER) SECOND-LEVEL TRIAGE: You need to be seen within the next 3 or 4 hours. A nearby Urgent Care Center [facility]) is often a good source of care. Another choice is to go to the ED. Go sooner if you become worse. GENERAL CARE ADVICE FOR COVID-19 SYMPTOMS: * Cough: Use cough drops. * Feeling dehydrated: Drink extra liquids. If the air in your home is dry, use a humidifier. * Fever: For fever over 101 F (38.3 C), take acetaminophen every 4 to 6 hours (Adults 650 mg) OR ibuprofen every 6 to 8 hours (Adults 400 mg). Before taking any medicine, read all the instructions on the package. Do not take aspirin unless your doctor has prescribed it for you. * Muscle aches, headache, and other pains: Often this comes and goes with the fever. Take acetaminophen every 4 to 6 hours (Adults 650 mg) OR ibuprofen every 6 to 8 hours (Adults 400 mg). Before taking any medicine, read all the instructions on the package. * Sore throat: Try throat lozenges, hard candy or warm chicken broth. CALL BACK IF: * You become worse CARE ADVICE given per COVID-19 - DIAGNOSED OR SUSPECTED (Adult) guideline. Comments User: Letta Moynahan, RN Date/Time Eilene Ghazi Time): 01/08/2021 4:33:08 PM pt has a mild cough and only has SOB when coughing. User: Letta Moynahan, RN Date/Time Eilene Ghazi Time): 01/08/2021 4:38:56 PM advised that can take OTC cough syrups  Referrals GO TO FACILITY REFUSED

## 2021-01-10 ENCOUNTER — Inpatient Hospital Stay: Admission: RE | Admit: 2021-01-10 | Payer: Self-pay | Source: Ambulatory Visit

## 2021-01-10 NOTE — Telephone Encounter (Signed)
Fyi.

## 2021-01-25 ENCOUNTER — Ambulatory Visit (INDEPENDENT_AMBULATORY_CARE_PROVIDER_SITE_OTHER)
Admission: RE | Admit: 2021-01-25 | Discharge: 2021-01-25 | Disposition: A | Discharge status: Home / Self Care | Payer: Self-pay | Source: Ambulatory Visit | Attending: Family Medicine | Admitting: Family Medicine

## 2021-01-25 ENCOUNTER — Other Ambulatory Visit: Payer: Self-pay

## 2021-01-25 DIAGNOSIS — E785 Hyperlipidemia, unspecified: Secondary | ICD-10-CM

## 2021-01-26 ENCOUNTER — Other Ambulatory Visit: Payer: Self-pay | Admitting: *Deleted

## 2021-01-26 MED ORDER — ATORVASTATIN CALCIUM 40 MG PO TABS: 40.0000 mg | ORAL_TABLET | Freq: Every day | ORAL | 3 refills | Status: DC

## 2021-01-26 NOTE — Progress Notes (Signed)
Please inform patient of the following:  His CT scan shows that he has not a calcification buildup in the arteries of his heart.  This is not an urgent concern but we need to be more aggressive with his risk factor reduction.  Recommend starting Lipitor 40 mg daily and referring him to see a cardiologist. Please place orders if patient is agreeable.  Edward Burton. Jerline Pain, MD 01/26/2021 8:04 AM

## 2021-01-30 ENCOUNTER — Ambulatory Visit (INDEPENDENT_AMBULATORY_CARE_PROVIDER_SITE_OTHER): Payer: Medicare Other | Admitting: Family Medicine

## 2021-01-30 VITALS — BP 158/80 | HR 79 | Wt 334.0 lb

## 2021-01-30 DIAGNOSIS — J309 Allergic rhinitis, unspecified: Secondary | ICD-10-CM | POA: Diagnosis not present

## 2021-01-30 DIAGNOSIS — R319 Hematuria, unspecified: Secondary | ICD-10-CM

## 2021-01-30 DIAGNOSIS — E785 Hyperlipidemia, unspecified: Secondary | ICD-10-CM | POA: Diagnosis not present

## 2021-01-30 DIAGNOSIS — I10 Essential (primary) hypertension: Secondary | ICD-10-CM

## 2021-01-30 LAB — POCT URINALYSIS DIPSTICK
Bilirubin, UA: NEGATIVE
Blood, UA: NEGATIVE
Glucose, UA: NEGATIVE
Ketones, UA: NEGATIVE
Leukocytes, UA: NEGATIVE
Nitrite, UA: NEGATIVE
Protein, UA: NEGATIVE
Spec Grav, UA: >=1.03 — AB (ref 1.010–1.025)
Urobilinogen, UA: 1 E.U./dL
pH, UA: 6 (ref 5.0–8.0)

## 2021-01-30 LAB — URINALYSIS, ROUTINE W REFLEX MICROSCOPIC
Bilirubin Urine: NEGATIVE
Hgb urine dipstick: NEGATIVE
Ketones, ur: NEGATIVE
Leukocytes,Ua: NEGATIVE
Nitrite: NEGATIVE
RBC / HPF: NONE SEEN (ref 0–?)
Specific Gravity, Urine: 1.025 (ref 1.000–1.030)
Total Protein, Urine: NEGATIVE
Urine Glucose: NEGATIVE
Urobilinogen, UA: 0.2 (ref 0.0–1.0)
WBC, UA: NONE SEEN (ref 0–?)
pH: 6 (ref 5.0–8.0)

## 2021-01-30 MED ORDER — HYDROCORTISONE 2.5 % EX CREA: TOPICAL_CREAM | Freq: Two times a day (BID) | CUTANEOUS | 0 refills | Status: AC

## 2021-01-30 MED ORDER — MONTELUKAST SODIUM 10 MG PO TABS: 10.0000 mg | ORAL_TABLET | Freq: Every evening | ORAL | 3 refills | Status: DC

## 2021-01-30 NOTE — Assessment & Plan Note (Addendum)
Recently found to have elevated calcium score on cardiac CT scan.  We discussed results today.  We will pursue aggressive lipid-lowering goals.  We will start Lipitor 40 mg daily.  He will also discuss with his cardiologist.

## 2021-01-30 NOTE — Assessment & Plan Note (Signed)
Worsened recently.  We will restart Singulair.  He will continue his home inhalers.

## 2021-01-30 NOTE — Assessment & Plan Note (Signed)
Above goal today.  Is typically well controlled.  We will continue lisinopril 10 mg daily, Coreg 6.25 mg twice daily, and spironolactone 25 mg daily.

## 2021-01-30 NOTE — Progress Notes (Signed)
° °  Edward Burton is a 61 y.o. male who presents today for an office visit.  Assessment/Plan:  New/Acute Problems: Hematuria Seems to have resolved.  No blood on point-of-care UA today.  Likely had a kidney stone that has passed.  Will check culture to rule out UTI.  He will come back in 1 week to recheck UA.  If has recurrence we will need to check CT scan or refer back to urology.  Discussed reasons to return to care.  Chronic Problems Addressed Today: HTN (hypertension) Above goal today.  Is typically well controlled.  We will continue lisinopril 10 mg daily, Coreg 6.25 mg twice daily, and spironolactone 25 mg daily.  Allergic rhinitis Worsened recently.  We will restart Singulair.  He will continue his home inhalers.  Hyperlipidemia Recently found to have elevated calcium score on cardiac CT scan.  We discussed results today.  We will pursue aggressive lipid-lowering goals.  We will start Lipitor 40 mg daily.  He will also discuss with his cardiologist.    Subjective:  HPI:  Patient here with hematuria. Symptoms started 2 days ago. Has frequent urination at night. He notes he noticed bright red color when this started. However, symptoms have subsided over the last day. He notes symptoms has not reoccurred since then. Symptoms seems to  be improving. He did noticed some blood clot but thinks it was  kidney stone. Have some back pain and some burning with urination. He never had kidney stones in the past. No recent illness. No precipating events. Denies nausea or vomiting.  Pain seems to be improving.   Additionally, he is still having issue with his allergy. He notes he would like to try hydrocortisone cream for this. He is on Singulair 10 mg daily.        Objective:  Physical Exam: BP (!) 158/80 (BP Location: Right Arm, Patient Position: Sitting)    Pulse 79    Wt (!) 334 lb (151.5 kg)    SpO2 97%    BMI 46.58 kg/m   Gen: No acute distress, resting comfortably CV: Regular rate  and rhythm with no murmurs appreciated Pulm: Normal work of breathing, clear to auscultation bilaterally with no crackles, wheezes, or rhonchi MSK: No CVA tenderness Neuro: Grossly normal, moves all extremities Psych: Normal affect and thought content       I,Savera Zaman,acting as a scribe for Dimas Chyle, MD.,have documented all relevant documentation on the behalf of Dimas Chyle, MD,as directed by  Dimas Chyle, MD while in the presence of Dimas Chyle, MD.   I, Dimas Chyle, MD, have reviewed all documentation for this visit. The documentation on 01/30/21 for the exam, diagnosis, procedures, and orders are all accurate and complete.  Algis Greenhouse. Jerline Pain, MD 01/30/2021 10:35 AM

## 2021-01-30 NOTE — Addendum Note (Signed)
Addended by: Sheilah Pigeon A on: 01/30/2021 11:28 AM   Modules accepted: Orders

## 2021-01-30 NOTE — Patient Instructions (Signed)
It was very nice to see you today!  Your urine today does not have any blood in it.  You probably had a kidney stone that passed.  The pain should improve over the next day or 2.  We will check a culture to make sure you do not have a urinary tract infection.  Please come back in 1 week to let us recheck a urine sample.  Please let us know if you have any recurrence of symptoms over the next week or so or if the pain does not improve.  I will refill your medications today.  Take care, Dr Jerline Pain  PLEASE NOTE:  If you had any lab tests please let us know if you have not heard back within a few days. You may see your results on mychart before we have a chance to review them but we will give you a call once they are reviewed by [place]. If we ordered any referrals today, please let us know if you have not heard from their office within the next week.   Please try these tips to maintain a healthy lifestyle:  Eat at least 3 REAL meals and 1-2 snacks per day.  Aim for no more than 5 hours between eating.  If you eat breakfast, please do so within one hour of getting up.   Each meal should contain half fruits/vegetables, one quarter protein, and one quarter carbs (no bigger than a computer mouse)  Cut down on sweet beverages. This includes juice, soda, and sweet tea.   Drink at least 1 glass of water with each meal and aim for at least 8 glasses per day  Exercise at least 150 minutes every week.

## 2021-01-31 LAB — URINE CULTURE
MICRO NUMBER:: 12911736
Result:: NO GROWTH
SPECIMEN QUALITY:: ADEQUATE

## 2021-02-01 NOTE — Progress Notes (Signed)
Please inform patient of the following:  Urine culture is negative-he does not have a UTI.  There were very small crystals noted in his urine.  Like we discussed at his office visit he likely had a kidney stone that has since passed.  We should still recheck his urine in a week or 2 as we discussed.  I would like for him to let us know if any of his symptoms return.

## 2021-02-06 ENCOUNTER — Other Ambulatory Visit: Payer: Self-pay

## 2021-02-06 ENCOUNTER — Other Ambulatory Visit (INDEPENDENT_AMBULATORY_CARE_PROVIDER_SITE_OTHER): Payer: Medicare Other

## 2021-02-06 DIAGNOSIS — R739 Hyperglycemia, unspecified: Secondary | ICD-10-CM | POA: Diagnosis not present

## 2021-02-06 LAB — HEMOGLOBIN A1C: Hgb A1c MFr Bld: 8.3 % — ABNORMAL HIGH (ref 4.6–6.5)

## 2021-02-07 ENCOUNTER — Other Ambulatory Visit: Payer: Self-pay

## 2021-02-07 DIAGNOSIS — R319 Hematuria, unspecified: Secondary | ICD-10-CM

## 2021-02-07 NOTE — Progress Notes (Signed)
Please inform patient of the following:  His A1c is down about a point since a couple of months ago. We can recheck in 3 months. This needs to be an in person office visit.   Can we have him come back to check a UA? That is what he was supposed to be coming back for - not sure why he got an A1c instead.

## 2021-02-12 ENCOUNTER — Other Ambulatory Visit: Payer: Self-pay | Admitting: Family Medicine

## 2021-02-12 ENCOUNTER — Other Ambulatory Visit: Payer: Self-pay

## 2021-02-12 ENCOUNTER — Other Ambulatory Visit (INDEPENDENT_AMBULATORY_CARE_PROVIDER_SITE_OTHER): Payer: Medicare Other

## 2021-02-12 DIAGNOSIS — R319 Hematuria, unspecified: Secondary | ICD-10-CM

## 2021-02-12 LAB — URINALYSIS, ROUTINE W REFLEX MICROSCOPIC
Bilirubin Urine: NEGATIVE
Hgb urine dipstick: NEGATIVE
Ketones, ur: NEGATIVE
Leukocytes,Ua: NEGATIVE
Nitrite: NEGATIVE
RBC / HPF: NONE SEEN (ref 0–?)
Specific Gravity, Urine: 1.02 (ref 1.000–1.030)
Total Protein, Urine: NEGATIVE
Urine Glucose: NEGATIVE
Urobilinogen, UA: 0.2 (ref 0.0–1.0)
pH: 7 (ref 5.0–8.0)

## 2021-02-13 NOTE — Progress Notes (Signed)
Please inform patient of the following:  Blood in his urine has cleared but he still has some crystals. Would like for him to let us know if his pain returns.  Edward Burton. Jerline Pain, MD 02/13/2021 5:00 PM

## 2021-03-12 ENCOUNTER — Other Ambulatory Visit: Payer: Medicare Other

## 2021-03-12 ENCOUNTER — Other Ambulatory Visit: Payer: Self-pay

## 2021-05-01 ENCOUNTER — Other Ambulatory Visit: Payer: Self-pay | Admitting: Family Medicine

## 2021-05-01 ENCOUNTER — Other Ambulatory Visit: Payer: Self-pay | Admitting: *Deleted

## 2021-05-01 ENCOUNTER — Telehealth: Payer: Self-pay | Admitting: Family Medicine

## 2021-05-01 DIAGNOSIS — R0982 Postnasal drip: Secondary | ICD-10-CM

## 2021-05-01 DIAGNOSIS — J449 Chronic obstructive pulmonary disease, unspecified: Secondary | ICD-10-CM

## 2021-05-01 MED ORDER — TAMSULOSIN HCL 0.4 MG PO CAPS: 0.4000 mg | ORAL_CAPSULE | Freq: Every day | ORAL | 1 refills | Status: DC

## 2021-05-01 MED ORDER — IPRATROPIUM BROMIDE 0.03 % NA SOLN: NASAL | 2 refills | Status: DC

## 2021-05-01 MED ORDER — FUROSEMIDE 40 MG PO TABS: 40.0000 mg | ORAL_TABLET | Freq: Every day | ORAL | 1 refills | Status: DC

## 2021-05-01 NOTE — Telephone Encounter (Signed)
4 medications to refill ? ?Pt states he is leaving town soon. ?.. ?Encourage patient to contact the pharmacy for refills or they can request refills through [facility] ? ?LAST APPOINTMENT DATE:   ?01/30/21 ? ?NEXT APPOINTMENT DATE: ?N/a ? ?MEDICATION: ?tamsulosin (FLOMAX) 0.4 MG CAPS capsule [[id number]] ? ?ipratropium (ATROVENT) 0.03 % nasal spray [[id number]]  ? ?OZEMPIC, 0.25 OR 0.5 MG/DOSE, 2 MG/1.5ML SOPN [[id number]] ? ?furosemide (LASIX) 40 MG tablet [[id number]]  ? ?Is the patient out of medication?  ?Out of all. ?Two left of lasix ? ? ?PHARMACY: ?[id number] [place] ?[address] [id number] ?Phone:  [phone] Fax:  [phone] ?DEA #:  [id number]?DAW Reason: -- ? ? ? ?Let patient know to contact pharmacy at the end of the day to make sure medication is ready. ? ?Please notify patient to allow 48-72 hours to process  ?

## 2021-05-02 NOTE — Telephone Encounter (Signed)
Rx sent to pharmacy on 05/01/21  ?

## 2021-05-10 ENCOUNTER — Ambulatory Visit (INDEPENDENT_AMBULATORY_CARE_PROVIDER_SITE_OTHER): Payer: 59 | Admitting: Family Medicine

## 2021-05-10 VITALS — BP 156/86 | HR 82 | Temp 98.1°F | Ht 71.0 in | Wt 325.2 lb

## 2021-05-10 DIAGNOSIS — N400 Enlarged prostate without lower urinary tract symptoms: Secondary | ICD-10-CM | POA: Diagnosis not present

## 2021-05-10 DIAGNOSIS — R739 Hyperglycemia, unspecified: Secondary | ICD-10-CM | POA: Diagnosis not present

## 2021-05-10 DIAGNOSIS — E1165 Type 2 diabetes mellitus with hyperglycemia: Secondary | ICD-10-CM | POA: Diagnosis not present

## 2021-05-10 DIAGNOSIS — Z1211 Encounter for screening for malignant neoplasm of colon: Secondary | ICD-10-CM

## 2021-05-10 DIAGNOSIS — Z23 Encounter for immunization: Secondary | ICD-10-CM

## 2021-05-10 LAB — URINALYSIS, ROUTINE W REFLEX MICROSCOPIC
Bilirubin Urine: NEGATIVE
Hgb urine dipstick: NEGATIVE
Ketones, ur: NEGATIVE
Leukocytes,Ua: NEGATIVE
Nitrite: NEGATIVE
RBC / HPF: NONE SEEN (ref 0–?)
Specific Gravity, Urine: >=1.03 — AB (ref 1.000–1.030)
Total Protein, Urine: NEGATIVE
Urine Glucose: NEGATIVE
Urobilinogen, UA: 0.2 (ref 0.0–1.0)
pH: 6 (ref 5.0–8.0)

## 2021-05-10 LAB — POCT GLYCOSYLATED HEMOGLOBIN (HGB A1C): Hemoglobin A1C: 8.9 % — AB (ref 4.0–5.6)

## 2021-05-10 MED ORDER — OZEMPIC (0.25 OR 0.5 MG/DOSE) 2 MG/1.5ML ~~LOC~~ SOPN: 0.5000 mg | PEN_INJECTOR | SUBCUTANEOUS | 0 refills | Status: DC

## 2021-05-10 NOTE — Assessment & Plan Note (Signed)
A1c uncontrolled at 8.9.  He has only been taking his Ozempic once per month by mistake.  We discussed that he should be taking this once weekly.  He will start doing this this week at 0.5 mg weekly.  He will send a message in a few weeks and we will titrate the dose up as tolerated.  Would ideally like to get him to 2 mg weekly if possible.  He will continue taking metformin 1000 mg twice daily. ?

## 2021-05-10 NOTE — Assessment & Plan Note (Signed)
Still not controlled.  He is having more urgency.  We will check UA and culture to rule out UTI.  He will continue Flomax.  Will refer to urology. ?

## 2021-05-10 NOTE — Patient Instructions (Addendum)
It was very nice to see you today! ? ?Please make sure that you are taking Ozempic 0.5 mg WEEKLY.  ? ?Send me a message in a few weeks to let me know how you are doing and we can increase the dose to 1 mg weekly. ? ?We will check a urine sample today to make sure that you do not have a UTI and I will refer you to see a urologist. ? ?We will refer you for your colonoscopy. ? ?We will give your shingles vaccine today. ? ?Please come back to see me in 3 months.  Come back sooner if needed. ? ?Take care, ?Dr Jerline Pain ? ?PLEASE NOTE: ? ?If you had any lab tests please let us know if you have not heard back within a few days. You may see your results on mychart before we have a chance to review them but we will give you a call once they are reviewed by [place]. If we ordered any referrals today, please let us know if you have not heard from their office within the next week.  ? ?Please try these tips to maintain a healthy lifestyle: ? ?Eat at least 3 REAL meals and 1-2 snacks per day.  Aim for no more than 5 hours between eating.  If you eat breakfast, please do so within one hour of getting up.  ? ?Each meal should contain half fruits/vegetables, one quarter protein, and one quarter carbs (no bigger than a computer mouse) ? ?Cut down on sweet beverages. This includes juice, soda, and sweet tea.  ? ?Drink at least 1 glass of water with each meal and aim for at least 8 glasses per day ? ?Exercise at least 150 minutes every week.   ?

## 2021-05-10 NOTE — Progress Notes (Signed)
? ?  Edward Burton is a 61 y.o. male who presents today for an office visit. ? ?Assessment/Plan:  ?Chronic Problems Addressed Today: ?BPH (benign prostatic hyperplasia) ?Still not controlled.  He is having more urgency.  We will check UA and culture to rule out UTI.  He will continue Flomax.  Will refer to urology. ? ?T2DM (type 2 diabetes mellitus) ([place]) ?A1c uncontrolled at 8.9.  He has only been taking his Ozempic once per month by mistake.  We discussed that he should be taking this once weekly.  He will start doing this this week at 0.5 mg weekly.  He will send a message in a few weeks and we will titrate the dose up as tolerated.  Would ideally like to get him to 2 mg weekly if possible.  He will continue taking metformin 1000 mg twice daily. ? ?Morbid obesity ([place]) ?BMI 45.  We will be increasing his dose of Ozempic rapidly as above which should help with weight loss.  We can recheck again in 3 months. ? ?Shingles vaccine given today. ? ?  ?Subjective:  ?HPI: ? ?See A/p for status of chronic conditions.   ? ?   ?  ?Objective:  ?Physical Exam: ?BP (!) 152/86 (BP Location: Left Arm)   Pulse 82   Temp 98.1 ?F (36.7 ?C) (Temporal)   Ht '5\' 11"'$  (1.803 m)   Wt (!) 325 lb 3.2 oz (147.5 kg)   SpO2 96%   BMI 45.36 kg/m?   ?Wt Readings from Last 3 Encounters:  ?05/10/21 (!) 325 lb 3.2 oz (147.5 kg)  ?01/30/21 (!) 334 lb (151.5 kg)  ?11/24/20 (!) 326 lb 3.2 oz (148 kg)  ?Gen: No acute distress, resting comfortably ?CV: Regular rate and rhythm with no murmurs appreciated ?Pulm: Normal work of breathing, clear to auscultation bilaterally with no crackles, wheezes, or rhonchi ?Neuro: Grossly normal, moves all extremities ?Psych: Normal affect and thought content ? ?   ? ?Algis Greenhouse. Jerline Pain, MD ?05/10/2021 9:26 AM  ? ?

## 2021-05-10 NOTE — Assessment & Plan Note (Signed)
BMI 45.  We will be increasing his dose of Ozempic rapidly as above which should help with weight loss.  We can recheck again in 3 months. ?

## 2021-05-11 LAB — URINE CULTURE
MICRO NUMBER:: 13351829
Result:: NO GROWTH
SPECIMEN QUALITY:: ADEQUATE

## 2021-05-14 NOTE — Progress Notes (Signed)
Please inform patient of the following: ? ?Urine culture is negative. He needs to follow up with urology as we discussed at his office visit. ? ?Algis Greenhouse. Jerline Pain, MD ?05/14/2021 8:03 AM  ?
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
## 2021-05-29 ENCOUNTER — Ambulatory Visit (INDEPENDENT_AMBULATORY_CARE_PROVIDER_SITE_OTHER): Payer: 59 | Admitting: Urology

## 2021-05-29 VITALS — BP 166/84 | HR 97

## 2021-05-29 DIAGNOSIS — R350 Frequency of micturition: Secondary | ICD-10-CM | POA: Diagnosis not present

## 2021-05-29 DIAGNOSIS — R3915 Urgency of urination: Secondary | ICD-10-CM

## 2021-05-29 DIAGNOSIS — R351 Nocturia: Secondary | ICD-10-CM | POA: Diagnosis not present

## 2021-05-29 DIAGNOSIS — N138 Other obstructive and reflux uropathy: Secondary | ICD-10-CM

## 2021-05-29 DIAGNOSIS — N401 Enlarged prostate with lower urinary tract symptoms: Secondary | ICD-10-CM

## 2021-05-29 LAB — URINALYSIS, ROUTINE W REFLEX MICROSCOPIC
Bilirubin, UA: NEGATIVE
Glucose, UA: NEGATIVE
Ketones, UA: NEGATIVE
Leukocytes,UA: NEGATIVE
Nitrite, UA: NEGATIVE
Protein,UA: NEGATIVE
RBC, UA: NEGATIVE
Specific Gravity, UA: >1.03 — ABNORMAL HIGH (ref 1.005–1.030)
Urobilinogen, Ur: 0.2 mg/dL (ref 0.2–1.0)
pH, UA: 6 (ref 5.0–7.5)

## 2021-05-29 LAB — BLADDER SCAN AMB NON-IMAGING: Scan Result: 0

## 2021-06-22 ENCOUNTER — Telehealth: Payer: Self-pay | Admitting: *Deleted

## 2021-06-22 NOTE — Telephone Encounter (Signed)
NOTED

## 2021-06-22 NOTE — Telephone Encounter (Signed)
Please review pt. History and advise if can be done in [place] ?

## 2021-06-22 NOTE — Telephone Encounter (Signed)
Sheila,  This pt is cleared for anesthetic care at [facility].  Thanks,  Lenin Kuhnle 

## 2021-07-03 ENCOUNTER — Encounter: Payer: Self-pay | Admitting: Urology

## 2021-07-03 ENCOUNTER — Telehealth: Payer: Self-pay

## 2021-07-03 ENCOUNTER — Ambulatory Visit (AMBULATORY_SURGERY_CENTER): Payer: Self-pay | Admitting: *Deleted

## 2021-07-03 ENCOUNTER — Ambulatory Visit (INDEPENDENT_AMBULATORY_CARE_PROVIDER_SITE_OTHER): Payer: 59 | Admitting: Urology

## 2021-07-03 VITALS — Ht 71.0 in | Wt 325.0 lb

## 2021-07-03 VITALS — BP 143/65 | HR 83

## 2021-07-03 DIAGNOSIS — R351 Nocturia: Secondary | ICD-10-CM | POA: Diagnosis not present

## 2021-07-03 DIAGNOSIS — R3915 Urgency of urination: Secondary | ICD-10-CM | POA: Diagnosis not present

## 2021-07-03 DIAGNOSIS — Z1211 Encounter for screening for malignant neoplasm of colon: Secondary | ICD-10-CM

## 2021-07-03 DIAGNOSIS — N401 Enlarged prostate with lower urinary tract symptoms: Secondary | ICD-10-CM | POA: Diagnosis not present

## 2021-07-03 DIAGNOSIS — R350 Frequency of micturition: Secondary | ICD-10-CM | POA: Diagnosis not present

## 2021-07-03 DIAGNOSIS — N138 Other obstructive and reflux uropathy: Secondary | ICD-10-CM

## 2021-07-03 MED ORDER — NA SULFATE-K SULFATE-MG SULF 17.5-3.13-1.6 GM/177ML PO SOLN: 1.0000 | Freq: Once | ORAL | 0 refills | Status: AC
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
## 2021-07-03 NOTE — Progress Notes (Signed)
No egg or soy allergy known to patient  ?No issues known to pt with past sedation with any surgeries or procedures ?Patient denies ever being told they had issues or difficulty with intubation  ?No FH of Malignant Hyperthermia ?Pt is not on diet pills ?Pt is not on  home 02  ?Pt is not on blood thinners  ?Pt denies issues with constipation  ?No A fib or A flutter ? ?SUPREP Coupon to pt in PV today , Code to Pharmacy and  NO PA's for preps discussed with pt In PV today  ?Discussed with pt there will be an out-of-pocket cost for prep and that varies from $0 to 70 +  dollars - pt verbalized understanding  ?Pt instructed to use Singlecare.com or GoodRx for a price reduction on prep  ? ?PV completed over the phone. Pt verified name, DOB, address and insurance during PV today.  ?Pt mailed instruction packet with copy of consent form to read and not return, and instructions.  ?Pt encouraged to call with questions or issues.  ?If pt has My chart, procedure instructions sent via My Chart  ?Insurance confirmed with pt at PV today   ?

## 2021-07-04 LAB — URINALYSIS, ROUTINE W REFLEX MICROSCOPIC
Bilirubin, UA: NEGATIVE
Glucose, UA: NEGATIVE
Ketones, UA: NEGATIVE
Leukocytes,UA: NEGATIVE
Nitrite, UA: NEGATIVE
Protein,UA: NEGATIVE
RBC, UA: NEGATIVE
Specific Gravity, UA: 1.02 (ref 1.005–1.030)
Urobilinogen, Ur: 0.2 mg/dL (ref 0.2–1.0)
pH, UA: 6.5 (ref 5.0–7.5)

## 2021-07-23 ENCOUNTER — Telehealth: Payer: Self-pay | Admitting: Gastroenterology

## 2021-07-23 NOTE — Telephone Encounter (Signed)
Resent new suprep instructions to pt via mychart and mailed a copy to pt.

## 2021-07-24 ENCOUNTER — Encounter: Payer: 59 | Admitting: Gastroenterology

## 2021-08-02 ENCOUNTER — Ambulatory Visit (INDEPENDENT_AMBULATORY_CARE_PROVIDER_SITE_OTHER): Payer: 59 | Admitting: Physician Assistant

## 2021-08-02 VITALS — BP 147/76 | HR 69

## 2021-08-02 DIAGNOSIS — N401 Enlarged prostate with lower urinary tract symptoms: Secondary | ICD-10-CM | POA: Diagnosis not present

## 2021-08-02 DIAGNOSIS — R350 Frequency of micturition: Secondary | ICD-10-CM

## 2021-08-02 DIAGNOSIS — N138 Other obstructive and reflux uropathy: Secondary | ICD-10-CM

## 2021-08-02 LAB — BLADDER SCAN AMB NON-IMAGING: Scan Result: 0

## 2021-08-02 MED ORDER — GEMTESA 75 MG PO TABS: 75.0000 mg | ORAL_TABLET | Freq: Every day | ORAL | 11 refills | Status: DC
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
## 2021-08-03 LAB — URINALYSIS, ROUTINE W REFLEX MICROSCOPIC
Bilirubin, UA: NEGATIVE
Glucose, UA: NEGATIVE
Leukocytes,UA: NEGATIVE
Nitrite, UA: NEGATIVE
RBC, UA: NEGATIVE
Specific Gravity, UA: 1.025 (ref 1.005–1.030)
Urobilinogen, Ur: 0.2 mg/dL (ref 0.2–1.0)
pH, UA: 6 (ref 5.0–7.5)

## 2021-08-07 ENCOUNTER — Ambulatory Visit: Payer: 59 | Admitting: Urology

## 2021-08-10 ENCOUNTER — Ambulatory Visit: Payer: 59 | Admitting: Family Medicine

## 2021-08-29 DIAGNOSIS — D120 Benign neoplasm of cecum: Secondary | ICD-10-CM

## 2021-09-03 ENCOUNTER — Ambulatory Visit (AMBULATORY_SURGERY_CENTER): Payer: 59 | Admitting: Gastroenterology

## 2021-09-03 ENCOUNTER — Encounter: Payer: 59 | Admitting: Gastroenterology

## 2021-09-03 ENCOUNTER — Encounter: Payer: Self-pay | Admitting: Gastroenterology

## 2021-09-03 VITALS — BP 113/63 | HR 66 | Temp 98.8°F | Resp 11 | Ht 71.0 in | Wt 325.5 lb

## 2021-09-03 DIAGNOSIS — Z1211 Encounter for screening for malignant neoplasm of colon: Secondary | ICD-10-CM

## 2021-09-03 DIAGNOSIS — D120 Benign neoplasm of cecum: Secondary | ICD-10-CM

## 2021-09-03 MED ORDER — SODIUM CHLORIDE 0.9 % IV SOLN: 500.0000 mL | Freq: Once | INTRAVENOUS | Status: DC

## 2021-09-03 NOTE — Progress Notes (Signed)
Pt's states no medical or surgical changes since previsit or office visit. 

## 2021-09-03 NOTE — Patient Instructions (Signed)
YOU HAD AN ENDOSCOPIC PROCEDURE TODAY AT [facility]:   Refer to the procedure report that was given to you for any specific questions about what was found during the examination.  If the procedure report does not answer your questions, please call your gastroenterologist to clarify.  If you requested that your care partner not be given the details of your procedure findings, then the procedure report has been included in a sealed envelope for you to review at your convenience later.  YOU SHOULD EXPECT: Some feelings of bloating in the abdomen. Passage of more gas than usual.  Walking can help get rid of the air that was put into your GI tract during the procedure and reduce the bloating. If you had a lower endoscopy (such as a colonoscopy or flexible sigmoidoscopy) you may notice spotting of blood in your stool or on the toilet paper. If you underwent a bowel prep for your procedure, you may not have a normal bowel movement for a few days.  Please Note:  You might notice some irritation and congestion in your nose or some drainage.  This is from the oxygen used during your procedure.  There is no need for concern and it should clear up in a day or so.  SYMPTOMS TO REPORT IMMEDIATELY:  Following lower endoscopy (colonoscopy or flexible sigmoidoscopy):  Excessive amounts of blood in the stool  Significant tenderness or worsening of abdominal pains  Swelling of the abdomen that is new, acute  Fever of 100F or higher    For urgent or emergent issues, a gastroenterologist can be reached at any hour by calling [phone]. Do not use MyChart messaging for urgent concerns.    DIET:  We do recommend a small meal at first, but then you may proceed to your regular diet.  Drink plenty of fluids but you should avoid alcoholic beverages for 24 hours.  MEDICATIONS: Continue present medications.   Please see handouts given to you by your recovery nurse.  Thank you for allowing [place] to  provide for your healthcare needs today.  ACTIVITY:  You should plan to take it easy for the rest of today and you should NOT DRIVE or use heavy machinery until tomorrow (because of the sedation medicines used during the test).    FOLLOW UP: Our staff will call the number listed on your records the next business day following your procedure.  We will call around 7:15- 8:00 am to check on you and address any questions or concerns that you may have regarding the information given to you following your procedure. If we do not reach you, we will leave a message.  If you develop any symptoms (ie: fever, flu-like symptoms, shortness of breath, cough etc.) before then, please call [phone].  If you test positive for Covid 19 in the 2 weeks post procedure, please call and report this information to [place].    If any biopsies were taken you will be contacted by phone or by letter within the next 1-3 weeks.  Please call us at [phone] if you have not heard about the biopsies in 3 weeks.    SIGNATURES/CONFIDENTIALITY: You and/or your care partner have signed paperwork which will be entered into your electronic medical record.  These signatures attest to the fact that that the information above on your After Visit Summary has been reviewed and is understood.  Full responsibility of the confidentiality of this discharge information lies with you and/or your care-partner.

## 2021-09-03 NOTE — Op Note (Signed)
Edward Burton Patient Name: Edward Burton Procedure Date: 09/03/2021 7:58 AM MRN: [id number] Endoscopist: Remo Lipps P. Havery Moros , MD Age: 61 Referring MD:  Date of Birth: Jul 16, 1960 Gender: Male Account #: [id number] Procedure:                Colonoscopy Indications:              Screening for colorectal malignant neoplasm Medicines:                Monitored Anesthesia Care Procedure:                Pre-Anesthesia Assessment:                           - Prior to the procedure, a History and Physical                            was performed, and patient medications and                            allergies were reviewed. The patient's tolerance of                            previous anesthesia was also reviewed. The risks                            and benefits of the procedure and the sedation                            options and risks were discussed with the patient.                            All questions were answered, and informed consent                            was obtained. Prior Anticoagulants: The patient has                            taken no previous anticoagulant or antiplatelet                            agents. ASA Grade Assessment: III - A patient with                            severe systemic disease. After reviewing the risks                            and benefits, the patient was deemed in                            satisfactory condition to undergo the procedure.                           After obtaining informed consent, the colonoscope  was passed under direct vision. Throughout the                            procedure, the patient's blood pressure, pulse, and                            oxygen saturations were monitored continuously. The                            CF [id number] #[id number] was introduced through the anus                            and advanced to the the cecum, identified by                            appendiceal  orifice and ileocecal valve. The                            colonoscopy was performed without difficulty. The                            patient tolerated the procedure well. The quality                            of the bowel preparation was adequate. The                            ileocecal valve, appendiceal orifice, and rectum                            were photographed. Scope In: 8:02:18 AM Scope Out: 8:18:56 AM Scope Withdrawal Time: 0 hours 13 minutes 30 seconds  Total Procedure Duration: 0 hours 16 minutes 38 seconds  Findings:                 The perianal and digital rectal examinations were                            normal.                           A diminutive polyp was found in the cecum. The                            polyp was sessile. The polyp was removed with a                            cold snare. Resection and retrieval were complete.                           Internal hemorrhoids were found during                            retroflexion. The hemorrhoids were small.  The exam was otherwise without abnormality. Complications:            No immediate complications. Estimated blood loss:                            Minimal. Estimated Blood Loss:     Estimated blood loss was minimal. Impression:               - One diminutive polyp in the cecum, removed with a                            cold snare. Resected and retrieved.                           - Internal hemorrhoids.                           - The examination was otherwise normal. Recommendation:           - Patient has a contact number available for                            emergencies. The signs and symptoms of potential                            delayed complications were discussed with the                            patient. Return to normal activities tomorrow.                            Written discharge instructions were provided to the                            patient.                            - Resume previous diet.                           - Continue present medications.                           - Await pathology results. Remo Lipps P. Havery Moros, MD 09/03/2021 8:24:46 AM This report has been signed electronically.

## 2021-09-03 NOTE — Progress Notes (Signed)
Called to room to assist during endoscopic procedure.  Patient ID and intended procedure confirmed with present staff. Received instructions for my participation in the procedure from the performing physician.  
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
## 2021-09-03 NOTE — Progress Notes (Signed)
Report to pacu rn. Vss. Care resumed by rn. 

## 2021-09-04 ENCOUNTER — Telehealth: Payer: Self-pay

## 2021-09-04 NOTE — Telephone Encounter (Signed)
No answer, left message to call if having any issues or concerns, B.Duvan Mousel RN 

## 2021-10-01 ENCOUNTER — Encounter: Payer: Self-pay | Admitting: *Deleted

## 2021-10-03 ENCOUNTER — Encounter: Payer: Self-pay | Admitting: Internal Medicine

## 2021-10-03 ENCOUNTER — Ambulatory Visit (INDEPENDENT_AMBULATORY_CARE_PROVIDER_SITE_OTHER): Payer: 59 | Admitting: Internal Medicine

## 2021-10-03 VITALS — BP 134/76 | HR 73 | Ht 71.0 in | Wt 318.8 lb

## 2021-10-03 DIAGNOSIS — M5414 Radiculopathy, thoracic region: Secondary | ICD-10-CM

## 2021-10-03 DIAGNOSIS — R079 Chest pain, unspecified: Secondary | ICD-10-CM | POA: Insufficient documentation

## 2021-10-03 DIAGNOSIS — R0789 Other chest pain: Secondary | ICD-10-CM

## 2021-10-03 HISTORY — DX: Chest pain, unspecified: R07.9

## 2021-10-03 MED ORDER — TIZANIDINE HCL 4 MG PO TABS
ORAL_TABLET | ORAL | 0 refills | Status: DC
Start: 2021-10-03 — End: 2022-08-29

## 2021-10-03 MED ORDER — GABAPENTIN 300 MG PO CAPS
300.0000 mg | ORAL_CAPSULE | Freq: Two times a day (BID) | ORAL | 1 refills | Status: DC
Start: 2021-10-03 — End: 2022-08-20

## 2021-10-03 MED ORDER — LIDOCAINE 5 % EX PTCH: 1.0000 | MEDICATED_PATCH | CUTANEOUS | 0 refills | Status: AC
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
## 2021-10-04 NOTE — Patient Instructions (Signed)
Health Maintenance, Male Adopting a healthy lifestyle and getting preventive care are important in promoting health and wellness. Ask your health care provider about: The right schedule for you to have regular tests and exams. Things you can do on your own to prevent diseases and keep yourself healthy. What should I know about diet, weight, and exercise? Eat a healthy diet  Eat a diet that includes plenty of vegetables, fruits, low-fat dairy products, and lean protein. Do not eat a lot of foods that are high in solid fats, added sugars, or sodium. Maintain a healthy weight Body mass index (BMI) is a measurement that can be used to identify possible weight problems. It estimates body fat based on height and weight. Your health care provider can help determine your BMI and help you achieve or maintain a healthy weight. Get regular exercise Get regular exercise. This is one of the most important things you can do for your health. Most adults should: Exercise for at least 150 minutes each week. The exercise should increase your heart rate and make you sweat (moderate-intensity exercise). Do strengthening exercises at least twice a week. This is in addition to the moderate-intensity exercise. Spend less time sitting. Even light physical activity can be beneficial. Watch cholesterol and blood lipids Have your blood tested for lipids and cholesterol at 60 years of age, then have this test every 5 years. You may need to have your cholesterol levels checked more often if: Your lipid or cholesterol levels are high. You are older than 60 years of age. You are at high risk for heart disease. What should I know about cancer screening? Many types of cancers can be detected early and may often be prevented. Depending on your health history and family history, you may need to have cancer screening at various ages. This may include screening for: Colorectal cancer. Prostate cancer. Skin cancer. Lung  cancer. What should I know about heart disease, diabetes, and high blood pressure? Blood pressure and heart disease High blood pressure causes heart disease and increases the risk of stroke. This is more likely to develop in people who have high blood pressure readings or are overweight. Talk with your health care provider about your target blood pressure readings. Have your blood pressure checked: Every 3-5 years if you are 18-39 years of age. Every year if you are 40 years old or older. If you are between the ages of 65 and 75 and are a current or former smoker, ask your health care provider if you should have a one-time screening for abdominal aortic aneurysm (AAA). Diabetes Have regular diabetes screenings. This checks your fasting blood sugar level. Have the screening done: Once every three years after age 45 if you are at a normal weight and have a low risk for diabetes. More often and at a younger age if you are overweight or have a high risk for diabetes. What should I know about preventing infection? Hepatitis B If you have a higher risk for hepatitis B, you should be screened for this virus. Talk with your health care provider to find out if you are at risk for hepatitis B infection. Hepatitis C Blood testing is recommended for: Everyone born from 1945 through 1965. Anyone with known risk factors for hepatitis C. Sexually transmitted infections (STIs) You should be screened each year for STIs, including gonorrhea and chlamydia, if: You are sexually active and are younger than 60 years of age. You are older than 60 years of age and your   health care provider tells you that you are at risk for this type of infection. Your sexual activity has changed since you were last screened, and you are at increased risk for chlamydia or gonorrhea. Ask your health care provider if you are at risk. Ask your health care provider about whether you are at high risk for HIV. Your health care provider  may recommend a prescription medicine to help prevent HIV infection. If you choose to take medicine to prevent HIV, you should first get tested for HIV. You should then be tested every 3 months for as long as you are taking the medicine. Follow these instructions at home: Alcohol use Do not drink alcohol if your health care provider tells you not to drink. If you drink alcohol: Limit how much you have to 0-2 drinks a day. Know how much alcohol is in your drink. In the [place] one drink equals one 12 oz bottle of beer (355 mL), one 5 oz glass of Annalysse Shoemaker (148 mL), or one 1 oz glass of hard liquor (44 mL). Lifestyle Do not use any products that contain nicotine or tobacco. These products include cigarettes, chewing tobacco, and vaping devices, such as e-cigarettes. If you need help quitting, ask your health care provider. Do not use street drugs. Do not share needles. Ask your health care provider for help if you need support or information about quitting drugs. General instructions Schedule regular health, dental, and eye exams. Stay current with your vaccines. Tell your health care provider if: You often feel depressed. You have ever been abused or do not feel safe at home. Summary Adopting a healthy lifestyle and getting preventive care are important in promoting health and wellness. Follow your health care provider's instructions about healthy diet, exercising, and getting tested or screened for diseases. Follow your health care provider's instructions on monitoring your cholesterol and blood pressure. This information is not intended to replace advice given to you by your health care provider. Make sure you discuss any questions you have with your health care provider. Document Revised: 05/15/2020 Document Reviewed: 05/15/2020 [employer] Patient Education  2023 [employer]  
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
## 2021-10-05 ENCOUNTER — Ambulatory Visit (INDEPENDENT_AMBULATORY_CARE_PROVIDER_SITE_OTHER): Payer: 59 | Admitting: *Deleted

## 2021-10-05 DIAGNOSIS — Z0000 Encounter for general adult medical examination without abnormal findings: Secondary | ICD-10-CM

## 2021-11-02 ENCOUNTER — Ambulatory Visit (INDEPENDENT_AMBULATORY_CARE_PROVIDER_SITE_OTHER): Payer: 59 | Admitting: *Deleted

## 2021-11-02 ENCOUNTER — Other Ambulatory Visit: Payer: Self-pay | Admitting: Family Medicine

## 2021-11-02 DIAGNOSIS — Z23 Encounter for immunization: Secondary | ICD-10-CM

## 2021-11-13 ENCOUNTER — Other Ambulatory Visit: Payer: Self-pay | Admitting: *Deleted

## 2021-11-13 ENCOUNTER — Other Ambulatory Visit: Payer: Self-pay | Admitting: Family Medicine

## 2021-11-13 MED ORDER — OZEMPIC (0.25 OR 0.5 MG/DOSE) 2 MG/1.5ML ~~LOC~~ SOPN: 0.5000 mg | PEN_INJECTOR | SUBCUTANEOUS | 0 refills | Status: DC

## 2021-11-14 ENCOUNTER — Other Ambulatory Visit: Payer: Self-pay | Admitting: Family Medicine

## 2021-11-21 ENCOUNTER — Other Ambulatory Visit: Payer: Self-pay | Admitting: Family Medicine

## 2022-01-03 ENCOUNTER — Telehealth: Payer: Self-pay | Admitting: Family Medicine

## 2022-01-03 NOTE — Telephone Encounter (Signed)
  Encourage patient to contact the pharmacy for refills or they can request refills through [place]:  Please schedule appointment if longer than 1 year  NEXT APPOINTMENT DATE: 01/24/22  MEDICATION:  budesonide-formoterol (SYMBICORT) 160-4.5 MCG/ACT inhaler    Is the patient out of medication? Yes  PHARMACY:  [id number] [place], [place] - [id number] Phone: [phone] Fax: [phone]     Let patient know to contact pharmacy at the end of the day to make sure medication is ready.  Please notify patient to allow 48-72 hours to process    NEEDS REFILL ASAP, CAN HE GET JUST ENOUGH [place]

## 2022-01-08 ENCOUNTER — Other Ambulatory Visit: Payer: Self-pay

## 2022-01-08 MED ORDER — BUDESONIDE-FORMOTEROL FUMARATE 160-4.5 MCG/ACT IN AERO: INHALATION_SPRAY | RESPIRATORY_TRACT | 3 refills | Status: DC

## 2022-01-08 NOTE — Telephone Encounter (Signed)
Rx sent to pharmacy   

## 2022-01-17 ENCOUNTER — Encounter (INDEPENDENT_AMBULATORY_CARE_PROVIDER_SITE_OTHER): Payer: Self-pay | Admitting: Internal Medicine

## 2022-01-17 ENCOUNTER — Ambulatory Visit (INDEPENDENT_AMBULATORY_CARE_PROVIDER_SITE_OTHER): Payer: MEDICARE | Admitting: Internal Medicine

## 2022-01-17 VITALS — BP 136/88 | HR 76 | Temp 98.0°F | Ht 69.0 in | Wt 318.0 lb

## 2022-01-17 DIAGNOSIS — E1165 Type 2 diabetes mellitus with hyperglycemia: Secondary | ICD-10-CM

## 2022-01-17 DIAGNOSIS — I110 Hypertensive heart disease with heart failure: Secondary | ICD-10-CM

## 2022-01-17 DIAGNOSIS — I10 Essential (primary) hypertension: Secondary | ICD-10-CM

## 2022-01-17 DIAGNOSIS — Z7985 Long-term (current) use of injectable non-insulin antidiabetic drugs: Secondary | ICD-10-CM

## 2022-01-17 DIAGNOSIS — G4733 Obstructive sleep apnea (adult) (pediatric): Secondary | ICD-10-CM

## 2022-01-17 DIAGNOSIS — Z6841 Body Mass Index (BMI) 40.0 and over, adult: Secondary | ICD-10-CM

## 2022-01-17 DIAGNOSIS — I5020 Unspecified systolic (congestive) heart failure: Secondary | ICD-10-CM
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
## 2022-01-19 ENCOUNTER — Other Ambulatory Visit: Payer: Self-pay | Admitting: Family Medicine

## 2022-01-24 ENCOUNTER — Encounter: Payer: Self-pay | Admitting: Family Medicine

## 2022-01-24 ENCOUNTER — Ambulatory Visit (INDEPENDENT_AMBULATORY_CARE_PROVIDER_SITE_OTHER): Payer: Medicare HMO | Admitting: Family Medicine

## 2022-01-24 VITALS — BP 130/78 | HR 80 | Temp 97.3°F | Ht 69.0 in | Wt 317.0 lb

## 2022-01-24 DIAGNOSIS — Z0001 Encounter for general adult medical examination with abnormal findings: Secondary | ICD-10-CM

## 2022-01-24 DIAGNOSIS — I10 Essential (primary) hypertension: Secondary | ICD-10-CM

## 2022-01-24 DIAGNOSIS — Z125 Encounter for screening for malignant neoplasm of prostate: Secondary | ICD-10-CM | POA: Diagnosis not present

## 2022-01-24 DIAGNOSIS — J449 Chronic obstructive pulmonary disease, unspecified: Secondary | ICD-10-CM

## 2022-01-24 DIAGNOSIS — E785 Hyperlipidemia, unspecified: Secondary | ICD-10-CM | POA: Diagnosis not present

## 2022-01-24 DIAGNOSIS — E1165 Type 2 diabetes mellitus with hyperglycemia: Secondary | ICD-10-CM | POA: Diagnosis not present

## 2022-01-24 DIAGNOSIS — E1169 Type 2 diabetes mellitus with other specified complication: Secondary | ICD-10-CM

## 2022-01-24 DIAGNOSIS — I5020 Unspecified systolic (congestive) heart failure: Secondary | ICD-10-CM

## 2022-01-24 LAB — LIPID PANEL
Cholesterol: 152 mg/dL (ref 0–200)
HDL: 40.9 mg/dL (ref >39.00)
LDL Cholesterol: 86 mg/dL (ref 0–99)
NonHDL: 111.57
Total CHOL/HDL Ratio: 4
Triglycerides: 130 mg/dL (ref 0.0–149.0)
VLDL: 26 mg/dL (ref 0.0–40.0)

## 2022-01-24 LAB — CBC
HCT: 41.2 % (ref 39.0–52.0)
Hemoglobin: 13.4 g/dL (ref 13.0–17.0)
MCHC: 32.4 g/dL (ref 30.0–36.0)
MCV: 84.6 fl (ref 78.0–100.0)
Platelets: 218 10*3/uL (ref 150.0–400.0)
RBC: 4.88 Mil/uL (ref 4.22–5.81)
RDW: 13.9 % (ref 11.5–15.5)
WBC: 7.6 10*3/uL (ref 4.0–10.5)

## 2022-01-24 LAB — MICROALBUMIN / CREATININE URINE RATIO
Creatinine,U: 314 mg/dL
Microalb Creat Ratio: 0.6 mg/g (ref 0.0–30.0)
Microalb, Ur: 2 mg/dL — ABNORMAL HIGH (ref 0.0–1.9)

## 2022-01-24 LAB — COMPREHENSIVE METABOLIC PANEL
ALT: 17 U/L (ref 0–53)
AST: 16 U/L (ref 0–37)
Albumin: 4.1 g/dL (ref 3.5–5.2)
Alkaline Phosphatase: 66 U/L (ref 39–117)
BUN: 10 mg/dL (ref 6–23)
CO2: 33 mEq/L — ABNORMAL HIGH (ref 19–32)
Calcium: 9.3 mg/dL (ref 8.4–10.5)
Chloride: 98 mEq/L (ref 96–112)
Creatinine, Ser: 0.91 mg/dL (ref 0.40–1.50)
GFR: 91.48 mL/min (ref >60.00)
Glucose, Bld: 133 mg/dL — ABNORMAL HIGH (ref 70–99)
Potassium: 4 mEq/L (ref 3.5–5.1)
Sodium: 139 mEq/L (ref 135–145)
Total Bilirubin: 0.5 mg/dL (ref 0.2–1.2)
Total Protein: 8 g/dL (ref 6.0–8.3)

## 2022-01-24 LAB — TSH: TSH: 1.52 u[IU]/mL (ref 0.35–5.50)

## 2022-01-24 LAB — PSA: PSA: 0.64 ng/mL (ref 0.10–4.00)

## 2022-01-24 LAB — HEMOGLOBIN A1C: Hgb A1c MFr Bld: 9.9 % — ABNORMAL HIGH (ref 4.6–6.5)

## 2022-01-24 MED ORDER — IPRATROPIUM-ALBUTEROL 0.5-2.5 (3) MG/3ML IN SOLN: RESPIRATORY_TRACT | 1 refills | Status: DC

## 2022-01-24 MED ORDER — ALBUTEROL SULFATE (2.5 MG/3ML) 0.083% IN NEBU: 2.5000 mg | INHALATION_SOLUTION | Freq: Four times a day (QID) | RESPIRATORY_TRACT | 1 refills | Status: DC | PRN

## 2022-01-24 MED ORDER — IPRATROPIUM-ALBUTEROL 20-100 MCG/ACT IN AERS: 1.0000 | INHALATION_SPRAY | Freq: Four times a day (QID) | RESPIRATORY_TRACT | 5 refills | Status: AC | PRN

## 2022-01-24 MED ORDER — OZEMPIC (0.25 OR 0.5 MG/DOSE) 2 MG/1.5ML ~~LOC~~ SOPN: 0.5000 mg | PEN_INJECTOR | SUBCUTANEOUS | 0 refills | Status: DC

## 2022-01-24 MED ORDER — GEMTESA 75 MG PO TABS: 75.0000 mg | ORAL_TABLET | Freq: Every day | ORAL | 11 refills | Status: DC

## 2022-01-24 NOTE — Assessment & Plan Note (Signed)
Symptoms are overall stable.  Will continue Symbicort and albuterol.  Will refill today.

## 2022-01-24 NOTE — Assessment & Plan Note (Signed)
No signs of volume overload.  Follows with cardiology for this.  Will continue current regimen Lasix 40 mg daily, lisinopril 10 mg daily, spironolactone 25 mg daily, and Coreg 6.25 mg twice daily.

## 2022-01-24 NOTE — Assessment & Plan Note (Signed)
Check lipids today.  He is on Lipitor 40 mg daily.  Tolerating well.

## 2022-01-24 NOTE — Assessment & Plan Note (Signed)
Blood pressure at goal.  Initially elevated however at goal on recheck.  Continue current regimen lisinopril 10 mg daily, Coreg 6.25 mg twice daily and spironolactone 25 mg daily.

## 2022-01-24 NOTE — Assessment & Plan Note (Signed)
BMI 46 today.  Recently starting seeing the weight management clinic.  Will be restarting Ozempic as above which should help some with his weight loss.
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
## 2022-01-24 NOTE — Assessment & Plan Note (Signed)
We are checking A1c today with labs.  Last A1c was uncontrolled at 8.9.  Will be restarting Ozempic.  This should help him some with weight loss as well.  Start 0.25 mg weekly then increase to 5 mg weekly.  He will send a message in a few weeks via MyChart and we can titrate the dose as needed.  Will ideally try to get him to 2 mg weekly if tolerated.  Will need to have him come back in 3 months to recheck A1c.  He will follow-up with me in a few weeks via MyChart.

## 2022-01-24 NOTE — Patient Instructions (Signed)
It was very nice to see you today!  We will check blood work today.  I will refill your medications today.  Will restart your Ozempic.  Please take 0.25 mg weekly for 4 weeks and then increase to 0.5 mg weekly.  Let me know how this is working for you to a few weeks and we can increase the dose as needed.  We will see back in 3 months to recheck your A1c.  Please come back to see [place] sooner if needed.  Take care, Dr Jerline Pain  PLEASE NOTE:  If you had any lab tests, please let us know if you have not heard back within a few days. You may see your results on mychart before we have a chance to review them but we will give you a call once they are reviewed by [place].   If we ordered any referrals today, please let us know if you have not heard from their office within the next week.   If you had any urgent prescriptions sent in today, please check with the pharmacy within an hour of our visit to make sure the prescription was transmitted appropriately.   Please try these tips to maintain a healthy lifestyle:  Eat at least 3 REAL meals and 1-2 snacks per day.  Aim for no more than 5 hours between eating.  If you eat breakfast, please do so within one hour of getting up.   Each meal should contain half fruits/vegetables, one quarter protein, and one quarter carbs (no bigger than a computer mouse)  Cut down on sweet beverages. This includes juice, soda, and sweet tea.   Drink at least 1 glass of water with each meal and aim for at least 8 glasses per day  Exercise at least 150 minutes every week.    Preventive Care 71-1 Years Old, Male Preventive care refers to lifestyle choices and visits with your health care provider that can promote health and wellness. Preventive care visits are also called wellness exams. What can I expect for my preventive care visit? Counseling During your preventive care visit, your health care provider may ask about your: Medical history, including: Past medical  problems. Family medical history. Current health, including: Emotional well-being. Home life and relationship well-being. Sexual activity. Lifestyle, including: Alcohol, nicotine or tobacco, and drug use. Access to firearms. Diet, exercise, and sleep habits. Safety issues such as seatbelt and bike helmet use. Sunscreen use. Work and work Statistician. Physical exam Your health care provider will check your: Height and weight. These may be used to calculate your BMI (body mass index). BMI is a measurement that tells if you are at a healthy weight. Waist circumference. This measures the distance around your waistline. This measurement also tells if you are at a healthy weight and may help predict your risk of certain diseases, such as type 2 diabetes and high blood pressure. Heart rate and blood pressure. Body temperature. Skin for abnormal spots. What immunizations do I need?  Vaccines are usually given at various ages, according to a schedule. Your health care provider will recommend vaccines for you based on your age, medical history, and lifestyle or other factors, such as travel or where you work. What tests do I need? Screening Your health care provider may recommend screening tests for certain conditions. This may include: Lipid and cholesterol levels. Diabetes screening. This is done by checking your blood sugar (glucose) after you have not eaten for a while (fasting). Hepatitis B test. Hepatitis C test.  HIV (human immunodeficiency virus) test. STI (sexually transmitted infection) testing, if you are at risk. Lung cancer screening. Prostate cancer screening. Colorectal cancer screening. Talk with your health care provider about your test results, treatment options, and if necessary, the need for more tests. Follow these instructions at home: Eating and drinking  Eat a diet that includes fresh fruits and vegetables, whole grains, lean protein, and low-fat dairy  products. Take vitamin and mineral supplements as recommended by your health care provider. Do not drink alcohol if your health care provider tells you not to drink. If you drink alcohol: Limit how much you have to 0-2 drinks a day. Know how much alcohol is in your drink. In the [place] one drink equals one 12 oz bottle of beer (355 mL), one 5 oz glass of wine (148 mL), or one 1 oz glass of hard liquor (44 mL). Lifestyle Brush your teeth every morning and night with fluoride toothpaste. Floss one time each day. Exercise for at least 30 minutes 5 or more days each week. Do not use any products that contain nicotine or tobacco. These products include cigarettes, chewing tobacco, and vaping devices, such as e-cigarettes. If you need help quitting, ask your health care provider. Do not use drugs. If you are sexually active, practice safe sex. Use a condom or other form of protection to prevent STIs. Take aspirin only as told by your health care provider. Make sure that you understand how much to take and what form to take. Work with your health care provider to find out whether it is safe and beneficial for you to take aspirin daily. Find healthy ways to manage stress, such as: Meditation, yoga, or listening to music. Journaling. Talking to a trusted person. Spending time with friends and family. Minimize exposure to UV radiation to reduce your risk of skin cancer. Safety Always wear your seat belt while driving or riding in a vehicle. Do not drive: If you have been drinking alcohol. Do not ride with someone who has been drinking. When you are tired or distracted. While texting. If you have been using any mind-altering substances or drugs. Wear a helmet and other protective equipment during sports activities. If you have firearms in your house, make sure you follow all gun safety procedures. What's next? Go to your health care provider once a year for an annual wellness visit. Ask your  health care provider how often you should have your eyes and teeth checked. Stay up to date on all vaccines. This information is not intended to replace advice given to you by your health care provider. Make sure you discuss any questions you have with your health care provider. Document Revised: 06/21/2020 Document Reviewed: 06/21/2020 Elsevier Patient Education  [place].

## 2022-01-27 ENCOUNTER — Other Ambulatory Visit: Payer: Self-pay | Admitting: Family Medicine

## 2022-01-27 DIAGNOSIS — J449 Chronic obstructive pulmonary disease, unspecified: Secondary | ICD-10-CM

## 2022-01-27 DIAGNOSIS — R0982 Postnasal drip: Secondary | ICD-10-CM
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
## 2022-01-28 ENCOUNTER — Ambulatory Visit: Payer: 59 | Admitting: Physician Assistant

## 2022-01-29 ENCOUNTER — Ambulatory Visit (INDEPENDENT_AMBULATORY_CARE_PROVIDER_SITE_OTHER): Payer: 59 | Admitting: Urology

## 2022-01-29 ENCOUNTER — Encounter: Payer: Self-pay | Admitting: Urology

## 2022-01-29 VITALS — BP 164/88 | HR 83

## 2022-01-29 DIAGNOSIS — N401 Enlarged prostate with lower urinary tract symptoms: Secondary | ICD-10-CM

## 2022-01-29 DIAGNOSIS — R3915 Urgency of urination: Secondary | ICD-10-CM | POA: Diagnosis not present

## 2022-01-29 DIAGNOSIS — R350 Frequency of micturition: Secondary | ICD-10-CM

## 2022-01-29 DIAGNOSIS — N138 Other obstructive and reflux uropathy: Secondary | ICD-10-CM

## 2022-01-29 DIAGNOSIS — R351 Nocturia: Secondary | ICD-10-CM

## 2022-01-29 LAB — URINALYSIS, ROUTINE W REFLEX MICROSCOPIC
Bilirubin, UA: NEGATIVE
Glucose, UA: NEGATIVE
Ketones, UA: NEGATIVE
Leukocytes,UA: NEGATIVE
Nitrite, UA: NEGATIVE
Protein,UA: NEGATIVE
RBC, UA: NEGATIVE
Specific Gravity, UA: 1.025 (ref 1.005–1.030)
Urobilinogen, Ur: 0.2 mg/dL (ref 0.2–1.0)
pH, UA: 6 (ref 5.0–7.5)

## 2022-01-29 LAB — BLADDER SCAN AMB NON-IMAGING: Scan Result: 0

## 2022-01-29 MED ORDER — SOLIFENACIN SUCCINATE 10 MG PO TABS: 10.0000 mg | ORAL_TABLET | Freq: Every day | ORAL | 11 refills | Status: AC

## 2022-01-29 NOTE — Progress Notes (Signed)
Please inform patient of the following:  A1c is uncontrolled at 9.9.  Getting him on Ozempic will help bring this down.  Would like for him to take 0.'25mg'$  weekly for 4 weeks and then increase to 0.5 mg weekly.  He should follow-up with [place] if he has any issues.  I would like to see back in 3 months to recheck his A1c.  The rest of his labs are all stable and we can recheck in a year.

## 2022-01-29 NOTE — Progress Notes (Signed)
Pt here today for bladder scan. Bladder was scanned and 0 was visualized.    Performed by Mahlon Gabrielle, CMA  

## 2022-02-08 ENCOUNTER — Other Ambulatory Visit: Payer: Self-pay | Admitting: *Deleted

## 2022-03-13 ENCOUNTER — Encounter: Payer: Self-pay | Admitting: Family Medicine

## 2022-03-13 ENCOUNTER — Ambulatory Visit (INDEPENDENT_AMBULATORY_CARE_PROVIDER_SITE_OTHER): Payer: 59 | Admitting: Family Medicine

## 2022-03-13 VITALS — BP 112/71 | HR 71 | Temp 97.8°F | Ht 69.0 in | Wt 327.4 lb

## 2022-03-13 DIAGNOSIS — G4730 Sleep apnea, unspecified: Secondary | ICD-10-CM | POA: Diagnosis not present

## 2022-03-13 DIAGNOSIS — I10 Essential (primary) hypertension: Secondary | ICD-10-CM

## 2022-03-13 DIAGNOSIS — E1165 Type 2 diabetes mellitus with hyperglycemia: Secondary | ICD-10-CM | POA: Diagnosis not present

## 2022-03-13 NOTE — Assessment & Plan Note (Signed)
Concern for OSA with snoring.  Will place referral for sleep study that is in network at [facility].

## 2022-03-13 NOTE — Assessment & Plan Note (Signed)
Last A1c 9.9.  He has been back on Ozempic and is now at 0.5 mg weekly.  Tolerating well.  He will come back soon to recheck A1c.

## 2022-03-13 NOTE — Progress Notes (Signed)
   Edward [place] is a 62 y.o. male who presents today for an office visit.  Assessment/Plan:  Chronic Problems Addressed Today: Sleep-disordered breathing Concern for OSA with snoring.  Will place referral for sleep study that is in network at [facility].  T2DM (type 2 diabetes mellitus) ([place]) Last A1c 9.9.  He has been back on Ozempic and is now at 0.5 mg weekly.  Tolerating well.  He will come back soon to recheck A1c.  HTN (hypertension) Blood pressure at goal today on lisinopril 10 mg daily, carvedilol 6.25 mg twice daily, and spironolactone 25 mg daily.     Subjective:  HPI:  Patient here for follow up. Since his last visit he saw the cardiologist and they recommended he get a sleep study done to check for OSA.  Patient snores at night.  Does not get refreshing sleep.  Wife is also concerned about sleep apnea.  His cardiologist referred him for testing however at the place that he was referred to his out of network for his insurance and he would like for [place] to place a new referral today.       Objective:  Physical Exam: BP 112/71   Pulse 71   Temp 97.8 F (36.6 C) (Temporal)   Ht '5\' 9"'$  (1.753 m)   Wt (!) 327 lb 6.4 oz (148.5 kg)   SpO2 96%   BMI 48.35 kg/m   Gen: No acute distress, resting comfortably CV: Regular rate and rhythm with no murmurs appreciated Pulm: Normal work of breathing, clear to auscultation bilaterally with no crackles, wheezes, or rhonchi Neuro: Grossly normal, moves all extremities Psych: Normal affect and thought content      Sophiagrace Benbrook M. Jerline Pain, MD 03/13/2022 7:46 AM

## 2022-03-13 NOTE — Patient Instructions (Addendum)
It was very nice to see you today!  I will refer you for a sleep study at [facility].  We will see you back next month. Come back sooner if needed.   Take care, Dr Jerline Pain  PLEASE NOTE:  If you had any lab tests, please let us know if you have not heard back within a few days. You may see your results on mychart before we have a chance to review them but we will give you a call once they are reviewed by [place].   If we ordered any referrals today, please let us know if you have not heard from their office within the next week.   If you had any urgent prescriptions sent in today, please check with the pharmacy within an hour of our visit to make sure the prescription was transmitted appropriately.   Please try these tips to maintain a healthy lifestyle:  Eat at least 3 REAL meals and 1-2 snacks per day.  Aim for no more than 5 hours between eating.  If you eat breakfast, please do so within one hour of getting up.   Each meal should contain half fruits/vegetables, one quarter protein, and one quarter carbs (no bigger than a computer mouse)  Cut down on sweet beverages. This includes juice, soda, and sweet tea.   Drink at least 1 glass of water with each meal and aim for at least 8 glasses per day  Exercise at least 150 minutes every week.

## 2022-03-13 NOTE — Assessment & Plan Note (Signed)
Blood pressure at goal today on lisinopril 10 mg daily, carvedilol 6.25 mg twice daily, and spironolactone 25 mg daily.

## 2022-04-25 ENCOUNTER — Encounter: Payer: Self-pay | Admitting: Family Medicine

## 2022-04-25 ENCOUNTER — Ambulatory Visit (INDEPENDENT_AMBULATORY_CARE_PROVIDER_SITE_OTHER): Payer: 59 | Admitting: Family Medicine

## 2022-04-25 VITALS — BP 136/72 | HR 79 | Temp 97.8°F | Resp 16 | Ht 70.0 in | Wt 322.8 lb

## 2022-04-25 DIAGNOSIS — I5020 Unspecified systolic (congestive) heart failure: Secondary | ICD-10-CM

## 2022-04-25 DIAGNOSIS — Z7985 Long-term (current) use of injectable non-insulin antidiabetic drugs: Secondary | ICD-10-CM | POA: Diagnosis not present

## 2022-04-25 DIAGNOSIS — E1165 Type 2 diabetes mellitus with hyperglycemia: Secondary | ICD-10-CM

## 2022-04-25 DIAGNOSIS — I10 Essential (primary) hypertension: Secondary | ICD-10-CM

## 2022-04-25 DIAGNOSIS — J449 Chronic obstructive pulmonary disease, unspecified: Secondary | ICD-10-CM

## 2022-04-25 LAB — POCT GLYCOSYLATED HEMOGLOBIN (HGB A1C): Hemoglobin A1C: 11 % — AB (ref 4.0–5.6)

## 2022-04-25 MED ORDER — BLOOD GLUCOSE TEST VI STRP: 1.0000 | ORAL_STRIP | Freq: Three times a day (TID) | 0 refills | Status: DC

## 2022-04-25 MED ORDER — LANCET DEVICE MISC: 1.0000 | Freq: Three times a day (TID) | 0 refills | Status: AC

## 2022-04-25 MED ORDER — TIRZEPATIDE 5 MG/0.5ML ~~LOC~~ SOAJ: 5.0000 mg | SUBCUTANEOUS | 0 refills | Status: DC

## 2022-04-25 MED ORDER — LANCETS MISC. MISC: 1.0000 | Freq: Three times a day (TID) | 0 refills | Status: AC

## 2022-04-25 MED ORDER — BLOOD GLUCOSE MONITORING SUPPL DEVI: 1.0000 | Freq: Three times a day (TID) | 0 refills | Status: AC

## 2022-04-25 NOTE — Assessment & Plan Note (Signed)
Follows with cardiology.  No signs of volume overload.  Overall symptoms been stable.  Will continue his current regimen per cardiology.

## 2022-04-25 NOTE — Assessment & Plan Note (Signed)
Symptoms are overall stable however has been several years since he saw a local pulmonologist.  We will continue his Symbicort, albuterol, and Singulair and refer to rhinology.  He needs repeat PFTs.  Handicap placard form was completed today.

## 2022-04-25 NOTE — Assessment & Plan Note (Signed)
A1c 11.0.  We discussed the importance of glycemic control to prevent future complications.  He has only been taking 0.25 mg of Ozempic weekly.  Will switch to 5 mg of Mounjaro weekly.  Hopefully this will help with medication adherence and be a more effective dose for him.  He will continue to work on lifestyle modifications as well.  He will follow-up with me in a few weeks.  He is aware of potential side effects of Mounjaro.  Will check A1c in 3 months.

## 2022-04-25 NOTE — Assessment & Plan Note (Signed)
Patient is down about 5 pounds since her last visit.  Hopefully will have more weight loss with Mounjaro as above.

## 2022-04-25 NOTE — Progress Notes (Signed)
   Edward [place] is a 62 y.o. male who presents today for an office visit.  Assessment/Plan:  Chronic Problems Addressed Today: T2DM (type 2 diabetes mellitus) (HCC) A1c 11.0.  We discussed the importance of glycemic control to prevent future complications.  He has only been taking 0.25 mg of Ozempic weekly.  Will switch to 5 mg of Mounjaro weekly.  Hopefully this will help with medication adherence and be a more effective dose for him.  He will continue to work on lifestyle modifications as well.  He will follow-up with me in a few weeks.  He is aware of potential side effects of Mounjaro.  Will check A1c in 3 months.  HTN (hypertension) Blood pressure at goal today on current regimen per cardiology with lisinopril 10 mg daily, carvedilol 6.25 mg twice daily, and spironolactone 25 mg daily.  HFrEF (heart failure with reduced ejection fraction) (HCC) Follows with cardiology.  No signs of volume overload.  Overall symptoms been stable.  Will continue his current regimen per cardiology.  Morbid obesity (HCC) Patient is down about 5 pounds since her last visit.  Hopefully will have more weight loss with Mounjaro as above.  COPD (chronic obstructive pulmonary disease) (HCC) Symptoms are overall stable however has been several years since he saw a local pulmonologist.  We will continue his Symbicort, albuterol, and Singulair and refer to rhinology.  He needs repeat PFTs.  Handicap placard form was completed today.     Subjective:  HPI:  See A/P for status of chronic conditions.  Patient is here today for follow-up.  We saw him last about 5 or 6 weeks ago.  Last A1c 9.9.  At our last visit he was prescribed 0.5 mg Ozempic weekly.  He has seen his cardiologist last a couple of months ago.  They did not make any changes to his regimen at that time and continued him on Coreg 6.25 mg twice daily, Lasix 40 mg daily, lisinopril 10 mg daily, and spironolactone 25 mg daily.  Patient tells me  today that he has only been taking 0.25 mg of Ozempic weekly.  He has been trying to work on diet and exercise and has significantly reduced his sugar intake.  He request for [place] to fill out a handicap placard due to shortness of breath with exertion due to his COPD.       Objective:  Physical Exam: BP 136/72   Pulse 79   Temp 97.8 F (36.6 C) (Temporal)   Resp 16   Ht  (1.778 m)   Wt (!) 322 lb 12.8 oz (146.4 kg)   SpO2 94%   BMI 46.32 kg/m   Wt Readings from Last 3 Encounters:  04/25/22 (!) 322 lb 12.8 oz (146.4 kg)  03/13/22 (!) 327 lb 6.4 oz (148.5 kg)  01/24/22 (!) 317 lb (143.8 kg)    Gen: No acute distress, resting comfortably CV: Regular rate and rhythm with no murmurs appreciated Pulm: Normal work of breathing, clear to auscultation bilaterally with no crackles, wheezes, or rhonchi Neuro: Grossly normal, moves all extremities Psych: Normal affect and thought content      Azaylah Stailey M. [name], MD 04/25/2022 11:50 AM

## 2022-04-25 NOTE — Patient Instructions (Addendum)
It was very nice to see you today!  You sugar is too high.  We need to work on lowering your sugar.  Please stop the Ozempic and start Mounjaro.  Take 5 mg weekly.  Send me a message in a few weeks to let me know how you are doing with this.  I will refer you to see the pulmonologist in [place].  No follow-ups on file.   Take care, Dr [name]  PLEASE NOTE:  If you had any lab tests, please let us know if you have not heard back within a few days. You may see your results on mychart before we have a chance to review them but we will give you a call once they are reviewed by [place].   If we ordered any referrals today, please let us know if you have not heard from their office within the next week.   If you had any urgent prescriptions sent in today, please check with the pharmacy within an hour of our visit to make sure the prescription was transmitted appropriately.   Please try these tips to maintain a healthy lifestyle:  Eat at least 3 REAL meals and 1-2 snacks per day.  Aim for no more than 5 hours between eating.  If you eat breakfast, please do so within one hour of getting up.   Each meal should contain half fruits/vegetables, one quarter protein, and one quarter carbs (no bigger than a computer mouse)  Cut down on sweet beverages. This includes juice, soda, and sweet tea.   Drink at least 1 glass of water with each meal and aim for at least 8 glasses per day  Exercise at least 150 minutes every week.

## 2022-04-25 NOTE — Assessment & Plan Note (Signed)
Blood pressure at goal today on current regimen per cardiology with lisinopril 10 mg daily, carvedilol 6.25 mg twice daily, and spironolactone 25 mg daily.

## 2022-04-29 ENCOUNTER — Telehealth: Payer: Self-pay | Admitting: Family Medicine

## 2022-04-29 NOTE — Telephone Encounter (Signed)
This request is approved for the following time period: 04/29/2022 - 04/28/2025 Pharmacy notified, patient notified  Notification form printed and placed to be scan in Pt chart

## 2022-04-29 NOTE — Telephone Encounter (Signed)
(  Key: [id number]) - [id number] Mounjaro /0.5ML pen-injectors Status: PA RequestSent: April 22nd, 2024 Waiting for determination  Patient notified

## 2022-04-29 NOTE — Telephone Encounter (Signed)
Pt states insurance wont pay for [employer]. Please advise.

## 2022-05-01 ENCOUNTER — Telehealth: Payer: Self-pay | Admitting: Family Medicine

## 2022-05-01 ENCOUNTER — Other Ambulatory Visit: Payer: Self-pay | Admitting: *Deleted

## 2022-05-01 MED ORDER — TIRZEPATIDE 5 MG/0.5ML ~~LOC~~ SOAJ: 5.0000 mg | SUBCUTANEOUS | 0 refills | Status: DC

## 2022-05-01 NOTE — Telephone Encounter (Signed)
Patient states tirzepatide [facility]) 5 MG/0.5ML Pen is out of stock at all Pharmacies Patient called and was told it is a very hard medication to obtain.  Patient requests RX for alternative medication be sent to  [id number] - [place], [place] - [address] Phone: [phone]  Fax: [phone]      Patient requests to be advised.

## 2022-05-01 NOTE — Telephone Encounter (Signed)
[facility] [place]

## 2022-05-01 NOTE — Telephone Encounter (Signed)
Patient requesting ALTERNATIVE to [facility]  Per previous message-Pharm is out of stock  Please advise

## 2022-05-02 NOTE — Telephone Encounter (Signed)
Patient stated check with other pharmacy in his area and no supply,  Please advise

## 2022-05-02 NOTE — Telephone Encounter (Signed)
Ok to go back to [employer]. Please send in  weekly pen.  Katina Degree. [name], MD 05/02/2022 3:22 PM

## 2022-05-06 ENCOUNTER — Other Ambulatory Visit: Payer: Self-pay

## 2022-05-06 MED ORDER — SEMAGLUTIDE (1 MG/DOSE) 4 MG/3ML ~~LOC~~ SOPN: 1.0000 mg | PEN_INJECTOR | SUBCUTANEOUS | 0 refills | Status: DC

## 2022-05-06 NOTE — Telephone Encounter (Signed)
Unable to LMOVM, VM full to advise patient that script was sent into pharmacy

## 2022-05-06 NOTE — Telephone Encounter (Signed)
Patient aware that medication has been changed to Ozempic 1 mg

## 2022-05-06 NOTE — Telephone Encounter (Signed)
Ozempic 1 mg sent to pharmacy.
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
## 2022-06-10 ENCOUNTER — Ambulatory Visit (HOSPITAL_COMMUNITY)
Admission: RE | Admit: 2022-06-10 | Discharge: 2022-06-10 | Disposition: A | Discharge status: Home / Self Care | Payer: 59 | Source: Ambulatory Visit | Attending: Internal Medicine | Admitting: Internal Medicine

## 2022-06-10 ENCOUNTER — Encounter: Payer: Self-pay | Admitting: Internal Medicine

## 2022-06-10 ENCOUNTER — Ambulatory Visit (INDEPENDENT_AMBULATORY_CARE_PROVIDER_SITE_OTHER): Payer: 59 | Admitting: Internal Medicine

## 2022-06-10 VITALS — BP 161/77 | HR 88 | Ht 70.0 in | Wt 316.0 lb

## 2022-06-10 DIAGNOSIS — I10 Essential (primary) hypertension: Secondary | ICD-10-CM

## 2022-06-10 DIAGNOSIS — R0609 Other forms of dyspnea: Secondary | ICD-10-CM | POA: Insufficient documentation

## 2022-06-10 MED ORDER — VALSARTAN 160 MG PO TABS
160.0000 mg | ORAL_TABLET | Freq: Every day | ORAL | 11 refills | Status: DC
Start: 2022-06-10 — End: 2022-08-29

## 2022-06-10 MED ORDER — METHYLPREDNISOLONE ACETATE 80 MG/ML IJ SUSP
120.0000 mg | Freq: Once | INTRAMUSCULAR | Status: AC
Start: 2022-06-10 — End: 2022-06-10
  Administered 2022-06-10: 120 mg via INTRAMUSCULAR

## 2022-06-10 NOTE — Assessment & Plan Note (Signed)
Onset around 2014 when quit smoking assoc with over a hundred lb of wt gain to 316 at initial pulm eval 06/10/2022  - Allergy screen 06/10/2022 >  Eos 0. /  IgE   - trial off ACEi 06/10/2022 >>>  - 06/10/2022   Walked on RA  x  3  lap(s) =  approx 450  ft  @ brisk pace, stopped due to end of study  with lowest 02 sats 91% and no doe    I doubt he has much copd or airways dz at all at this point but unclear as also could have asthma that's optimally treated and looks good on rx though the wt gain and acei make him look worse so will continue the asthma whereas it  is until returns  with all meds in hand using a trust but verify approach to confirm accurate Medication  Reconciliation The principal here is that until we are certain that the  patients are doing what we've asked, it makes no sense to ask them to do more.

## 2022-06-10 NOTE — Patient Instructions (Addendum)
Stop lisinopril and start valsartan 160 mg one daily   No change in your medications  - remember zyrtec 10 mg one daily for itching sneezing and runny nose  Please remember to go to the lab department   for your tests - we will call you with the results when they are available.     Please remember to go to the  x-ray department  @  [facility] for your tests - we will call you with the results when they are available     Depomedrol 120 mg IM   Please schedule a follow up office visit in 4  weeks, call sooner if needed with all medications /inhalers/ solutions in hand so we can verify exactly what you are taking. This includes all medications from all doctors and over the counters - PLEASE separate them into two bags:  the ones you take automatically, no matter what, vs the ones you take just when you feel you need them "BAG #2 is UP TO YOU"  - this will really help [place] help you take your medications more effectively.

## 2022-06-10 NOTE — Assessment & Plan Note (Addendum)
Try off acei due to cough and ? Pseudo asthma   In the best review of chronic cough to date ( NEJM 2016 375 4098-1191) ,  ACEi are now felt to cause cough in up to  20% of pts which is a 4 fold increase from previous reports and does not include the variety of non-specific complaints we see in pulmonary clinic in pts on ACEi but previously attributed to another dx like  Copd/asthma and  include PNDS, throat and chest congestion, "bronchitis", unexplained dyspnea and noct "strangling" sensations, and hoarseness, but also  atypical /refractory GERD symptoms like dysphagia and "bad heartburn"   The only way I know  to prove this is not an "ACEi Case" is a trial off ACEi x a minimum of 6 weeks then regroup.   >>>>> Rec change to valsartan p check baseline creat and f/u in 4 weeks

## 2022-06-10 NOTE — Assessment & Plan Note (Addendum)
   Body mass index is 45.34 kg/m.    Lab Results  Component Value Date   TSH 1.52 01/24/2022      Contributing to doe and risk of GERD >>>   reviewed the need and the process to achieve and maintain neg calorie balance > defer f/u primary care including intermittently monitoring thyroid status      Each maintenance medication was reviewed in detail including emphasizing most importantly the difference between maintenance and prns and under what circumstances the prns are to be triggered using an action plan format where appropriate.  Total time for H and P, chart review, counseling, reviewing hfa/dpi/neb device(s) , directly observing portions of ambulatory 02 saturation study/ and generating customized AVS unique to this office visit / same day charting  > 60 min new pt eval

## 2022-06-13 LAB — BRAIN NATRIURETIC PEPTIDE: BNP: 4.6 pg/mL (ref 0.0–100.0)

## 2022-06-13 LAB — CBC WITH DIFFERENTIAL/PLATELET
Basophils Absolute: 0 10*3/uL (ref 0.0–0.2)
Basos: 0 %
EOS (ABSOLUTE): 0.1 10*3/uL (ref 0.0–0.4)
Eos: 2 %
Hematocrit: 41 % (ref 37.5–51.0)
Hemoglobin: 13.2 g/dL (ref 13.0–17.7)
Immature Grans (Abs): 0 10*3/uL (ref 0.0–0.1)
Immature Granulocytes: 0 %
Lymphocytes Absolute: 1.9 10*3/uL (ref 0.7–3.1)
Lymphs: 31 %
MCH: 27 pg (ref 26.6–33.0)
MCHC: 32.2 g/dL (ref 31.5–35.7)
MCV: 84 fL (ref 79–97)
Monocytes Absolute: 0.5 10*3/uL (ref 0.1–0.9)
Monocytes: 9 %
Neutrophils Absolute: 3.4 10*3/uL (ref 1.4–7.0)
Neutrophils: 58 %
Platelets: 222 10*3/uL (ref 150–450)
RBC: 4.89 x10E6/uL (ref 4.14–5.80)
RDW: 12.4 % (ref 11.6–15.4)
WBC: 6 10*3/uL (ref 3.4–10.8)

## 2022-06-13 LAB — BASIC METABOLIC PANEL
BUN/Creatinine Ratio: 12 (ref 10–24)
BUN: 10 mg/dL (ref 8–27)
CO2: 23 mmol/L (ref 20–29)
Calcium: 9.1 mg/dL (ref 8.6–10.2)
Chloride: 103 mmol/L (ref 96–106)
Creatinine, Ser: 0.86 mg/dL (ref 0.76–1.27)
Glucose: 112 mg/dL — ABNORMAL HIGH (ref 70–99)
Potassium: 4.3 mmol/L (ref 3.5–5.2)
Sodium: 141 mmol/L (ref 134–144)
eGFR: 99 mL/min/{1.73_m2} (ref >59)

## 2022-06-13 LAB — IGE: IgE (Immunoglobulin E), Serum: 307 IU/mL (ref 6–495)

## 2022-06-17 ENCOUNTER — Other Ambulatory Visit: Payer: Self-pay

## 2022-06-17 DIAGNOSIS — R0982 Postnasal drip: Secondary | ICD-10-CM

## 2022-07-17 ENCOUNTER — Encounter: Payer: Self-pay | Admitting: Internal Medicine

## 2022-07-17 ENCOUNTER — Ambulatory Visit (INDEPENDENT_AMBULATORY_CARE_PROVIDER_SITE_OTHER): Payer: 59 | Admitting: Internal Medicine

## 2022-07-17 VITALS — BP 162/80 | HR 94 | Ht 70.0 in | Wt 300.0 lb

## 2022-07-17 DIAGNOSIS — I10 Essential (primary) hypertension: Secondary | ICD-10-CM | POA: Diagnosis not present

## 2022-07-17 DIAGNOSIS — R0609 Other forms of dyspnea: Secondary | ICD-10-CM

## 2022-07-17 NOTE — Assessment & Plan Note (Signed)
Body mass index is 43.05 kg/m.  -  trending down Lab Results  Component Value Date   TSH 1.52 01/24/2022      Contributing to doe and risk of GERD >>>   reviewed the need and the process to achieve and maintain neg calorie balance > defer f/u primary care including intermittently monitoring thyroid status     Pulm f/u is prn

## 2022-07-17 NOTE — Patient Instructions (Addendum)
My office will be contacting you by phone for referral to allergist in [place]  and take all your medications with you - if you don't hear back from my office within one week please call us back or notify us thru MyChart and we'll address it right away.   Start valsartan 160 mg one daily target blood pressure is around 120/80   Only use your albuterol-ipatropium ( combivent)  as a rescue medication to be used if you can't catch your breath by resting or doing a relaxed purse lip breathing pattern.  - The less you use it, the better it will work when you need it. - Ok to use up to 1puffs  every 4 hours if you must but call for immediate appointment if use goes up over your usual need - Don't leave home without it !!  (think of it like the spare tire for your car)   Also  Ok to try combivent x one puff 15 min before an activity (on alternating days)  that you know would usually make you short of breath and see if it makes any difference and if makes none then don't take albuterol after activity unless you can't catch your breath as this means it's the resting that helps, not the albuterol.       If you are satisfied with your treatment plan,  let your doctor know and he/she can either refill your medications or you can return here when your prescription runs out.     If in any way you are not 100% satisfied,  please tell us.  If 100% better, tell your friends!  Pulmonary follow up is as needed

## 2022-07-17 NOTE — Assessment & Plan Note (Addendum)
Onset around 2014 when quit smoking assoc with over a hundred lb of wt gain to 316 at initial pulm eval 06/10/2022  - Allergy screen 06/10/2022 >  Eos 0. /  IgE   - trial off ACEi 06/10/2022 >>>  - 06/10/2022   Walked on RA  x  3  lap(s) =  approx 450  ft  @ brisk pace, stopped due to end of study  with lowest 02 sats 91% and no doe    Denies any limitation from doe off acei > continue off and f/u PCP   He does still have active rhinitis symptoms with pos allergy screen and have not completely eliminated asthma from ddx so rec  1) allergy eval in [facility] 2) 07/17/2022  After extensive coaching inhaler device,  effectiveness =    75% with hfa and smi  2) approp saba; Re SABA :  I spent extra time with pt today reviewing appropriate use of albuterol for prn use on exertion with the following points: 1) saba is for relief of sob that does not improve by walking a slower pace or resting but rather if the pt does not improve after trying this first. 2) If the pt is convinced, as many are, that saba helps recover from activity faster then it's easy to tell if this is the case by re-challenging : ie stop, take the inhaler, then p 5 minutes try the exact same activity (intensity of workload) that just caused the symptoms and see if they are substantially diminished or not after saba 3) if there is an activity that reproducibly causes the symptoms, try the saba 15 min before the activity on alternate days   If in fact the saba really does help, then fine to continue to use it prn but advised may need to look closer at the maintenance regimen (for now =0)  being used to achieve better control of airways disease with exertion.            Each maintenance medication was reviewed in detail including emphasizing most importantly the difference between maintenance and prns and under what circumstances the prns are to be triggered using an action plan format where appropriate.  Total time for H and P, chart review,  counseling, reviewing hfa/smi  device(s) and generating customized AVS unique to this office visit / same day charting  > 20  min

## 2022-07-17 NOTE — Assessment & Plan Note (Signed)
Try off acei due to cough and ? Pseudo asthma > resolved as of ov 07/17/2022 > pumonary f/u prn  Although even in retrospect it may not be clear the ACEi contributed to the pt's symptoms,  Pt improved off them and adding them back at this point or in the future would risk confusion in interpretation of non-specific respiratory symptoms to which this patient is prone  ie  Better not to muddy the waters here.  Rec: Startt diovan 160 mg daily as prev rec, f/u pcp.
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
## 2022-07-31 ENCOUNTER — Other Ambulatory Visit: Payer: Self-pay | Admitting: Family Medicine

## 2022-07-31 DIAGNOSIS — G4730 Sleep apnea, unspecified: Secondary | ICD-10-CM

## 2022-08-19 ENCOUNTER — Other Ambulatory Visit: Payer: Self-pay | Admitting: Family Medicine

## 2022-08-20 ENCOUNTER — Other Ambulatory Visit: Payer: Self-pay | Admitting: Family Medicine

## 2022-08-20 ENCOUNTER — Other Ambulatory Visit: Payer: Self-pay | Admitting: *Deleted

## 2022-08-20 DIAGNOSIS — R0789 Other chest pain: Secondary | ICD-10-CM

## 2022-08-20 DIAGNOSIS — M5414 Radiculopathy, thoracic region: Secondary | ICD-10-CM

## 2022-08-20 MED ORDER — GABAPENTIN 300 MG PO CAPS
300.0000 mg | ORAL_CAPSULE | Freq: Two times a day (BID) | ORAL | 1 refills | Status: DC
Start: 2022-08-20 — End: 2022-08-29

## 2022-08-20 MED ORDER — GABAPENTIN 300 MG PO CAPS: 300.0000 mg | ORAL_CAPSULE | Freq: Two times a day (BID) | ORAL | 1 refills | Status: DC

## 2022-08-20 NOTE — Telephone Encounter (Signed)
Prescription Request  08/20/2022  LOV: 04/25/2022  What is the name of the medication or equipment? gabapentin (NEURONTIN) 300 MG capsule    Have you contacted your pharmacy to request a refill? Yes   Which pharmacy would you like this sent to?  [id number] [place], [place] - [address] [facility] [address] [place] [address] Phone: [phone] Fax: [phone]    Patient notified that their request is being sent to the clinical staff for review and that they should receive a response within 2 business days.   Please advise at Mobile [phone] (mobile)

## 2022-08-29 ENCOUNTER — Other Ambulatory Visit: Payer: Self-pay | Admitting: *Deleted

## 2022-08-29 ENCOUNTER — Ambulatory Visit (INDEPENDENT_AMBULATORY_CARE_PROVIDER_SITE_OTHER): Payer: 59 | Admitting: Family Medicine

## 2022-08-29 ENCOUNTER — Encounter: Payer: Self-pay | Admitting: Family Medicine

## 2022-08-29 VITALS — BP 137/74 | HR 77 | Temp 97.7°F | Ht 70.0 in | Wt 294.0 lb

## 2022-08-29 DIAGNOSIS — I5020 Unspecified systolic (congestive) heart failure: Secondary | ICD-10-CM

## 2022-08-29 DIAGNOSIS — I10 Essential (primary) hypertension: Secondary | ICD-10-CM

## 2022-08-29 DIAGNOSIS — E1165 Type 2 diabetes mellitus with hyperglycemia: Secondary | ICD-10-CM

## 2022-08-29 DIAGNOSIS — J449 Chronic obstructive pulmonary disease, unspecified: Secondary | ICD-10-CM

## 2022-08-29 DIAGNOSIS — M5414 Radiculopathy, thoracic region: Secondary | ICD-10-CM | POA: Diagnosis not present

## 2022-08-29 DIAGNOSIS — M545 Low back pain, unspecified: Secondary | ICD-10-CM

## 2022-08-29 DIAGNOSIS — R0789 Other chest pain: Secondary | ICD-10-CM | POA: Diagnosis not present

## 2022-08-29 DIAGNOSIS — Z7985 Long-term (current) use of injectable non-insulin antidiabetic drugs: Secondary | ICD-10-CM | POA: Diagnosis not present

## 2022-08-29 DIAGNOSIS — R0982 Postnasal drip: Secondary | ICD-10-CM

## 2022-08-29 LAB — POCT GLYCOSYLATED HEMOGLOBIN (HGB A1C): Hemoglobin A1C: 6.3 % — AB (ref 4.0–5.6)

## 2022-08-29 MED ORDER — TIZANIDINE HCL 4 MG PO TABS
ORAL_TABLET | ORAL | 0 refills | Status: DC
Start: 2022-08-29 — End: 2023-09-18

## 2022-08-29 MED ORDER — ATORVASTATIN CALCIUM 40 MG PO TABS: 40.0000 mg | ORAL_TABLET | Freq: Every day | ORAL | 3 refills | Status: AC

## 2022-08-29 MED ORDER — MONTELUKAST SODIUM 10 MG PO TABS: 10.0000 mg | ORAL_TABLET | Freq: Every day | ORAL | 3 refills | Status: AC

## 2022-08-29 MED ORDER — IPRATROPIUM-ALBUTEROL 0.5-2.5 (3) MG/3ML IN SOLN: RESPIRATORY_TRACT | 1 refills | Status: AC

## 2022-08-29 MED ORDER — GABAPENTIN 300 MG PO CAPS
300.0000 mg | ORAL_CAPSULE | Freq: Two times a day (BID) | ORAL | 1 refills | Status: DC
Start: 2022-08-29 — End: 2023-03-03

## 2022-08-29 MED ORDER — GABAPENTIN 300 MG PO CAPS
300.0000 mg | ORAL_CAPSULE | Freq: Two times a day (BID) | ORAL | 1 refills | Status: DC
Start: 2022-08-29 — End: 2022-08-29

## 2022-08-29 MED ORDER — FUROSEMIDE 40 MG PO TABS: 40.0000 mg | ORAL_TABLET | Freq: Every day | ORAL | 0 refills | Status: AC

## 2022-08-29 MED ORDER — IPRATROPIUM BROMIDE 0.03 % NA SOLN
NASAL | 1 refills | Status: DC
Start: 2022-08-29 — End: 2023-03-07

## 2022-08-29 MED ORDER — MOUNJARO 5 MG/0.5ML ~~LOC~~ SOAJ: 5.0000 mg | SUBCUTANEOUS | 3 refills | Status: DC

## 2022-08-29 NOTE — Assessment & Plan Note (Signed)
Stable.  He is not on Symbicort, albuterol, and Singulair per pulmonology.  Refills placed today.

## 2022-08-29 NOTE — Progress Notes (Signed)
   Edward Burton is a 62 y.o. male who presents today for an office visit.  Assessment/Plan:  Chronic Problems Addressed Today: Morbid obesity due to excess calories Edward Burton) Patient down about 20 pounds since her last visit.  Congratulated him on weight loss.  He is doing a great job with diet and exercise and he will continue to work on this.  Will recheck again in 3 to 6 months.  Recurrent low back pain Stable on Zanaflex as needed.  This is improving with weight loss as well.  Will refill Zanaflex today.  Has been tolerating well without any significant side effects.  COPD (chronic obstructive pulmonary disease) (HCC) Stable.  He is not on Symbicort, albuterol, and Singulair per pulmonology.  Refills placed today.  Essential hypertension Blood pressure at goal today off of his valsartan.  He is on spironolactone 25 mg daily and Coreg 6.25 mg twice daily  T2DM (type 2 diabetes mellitus) (HCC) A1c much improved today to 6.3.  He is doing well with Edward Burton is doing great job with diet and exercise.  Continue Mounjaro 5 mg weekly.  Recheck A1c in 3 to 6 months.  HFrEF (heart failure with reduced ejection fraction) Edward Burton) Following with cardiology.  Continue current regimen per cardiology.  No signs of volume overload today.     Subjective:  HPI:  See Assessment / plan for status of chronic of chronic conditions.   He is here today for follow-up.  Last seen about 4 months ago.  At our last visit A1c was 11.  We switched his Ozempic to Edward Burton at that time.  He has done well with this transition.  Has been working hard on diet and exercise as well.  We also referred him to pulmonology at our last visit.  He was able to establish care with them.  They stopped his ACE inhibitor and continued him on his albuterol and other maintenance medications.  He has been doing well the last several months.  He is down about 30 pounds.       Objective:  Physical Exam: BP 137/74   Pulse 77    Temp 97.7 F (36.5 C) (Temporal)   Ht 5\' 10"  (1.778 m)   Wt 294 lb (133.4 kg)   SpO2 96%   BMI 42.18 kg/m   Wt Readings from Last 3 Encounters:  08/29/22 294 lb (133.4 kg)  07/17/22 300 lb (136.1 kg)  06/10/22 (!) 316 lb (143.3 kg)    Gen: No acute distress, resting comfortably CV: Regular rate and rhythm with no murmurs appreciated Pulm: Normal work of breathing, clear to auscultation bilaterally with no crackles, wheezes, or rhonchi Neuro: Grossly normal, moves all extremities Psych: Normal affect and thought content      Edward Burton M. [name], MD 08/29/2022 8:51 AM

## 2022-08-29 NOTE — Patient Instructions (Signed)
It was very nice to see you today!  You are doing a great job! Keep up the great work!  Your A1c today is 6.3.  We will refill your medications today.  Will see you back in 3 to 6 months.  Come back sooner if needed.  Return in about 6 months (around 03/01/2023).   Take care, Dr [name]  PLEASE NOTE:  If you had any lab tests, please let us know if you have not heard back within a few days. You may see your results on mychart before we have a chance to review them but we will give you a call once they are reviewed by [place].   If we ordered any referrals today, please let us know if you have not heard from their office within the next week.   If you had any urgent prescriptions sent in today, please check with the pharmacy within an hour of our visit to make sure the prescription was transmitted appropriately.   Please try these tips to maintain a healthy lifestyle:  Eat at least 3 REAL meals and 1-2 snacks per day.  Aim for no more than 5 hours between eating.  If you eat breakfast, please do so within one hour of getting up.   Each meal should contain half fruits/vegetables, one quarter protein, and one quarter carbs (no bigger than a computer mouse)  Cut down on sweet beverages. This includes juice, soda, and sweet tea.   Drink at least 1 glass of water with each meal and aim for at least 8 glasses per day  Exercise at least 150 minutes every week.

## 2022-08-29 NOTE — Assessment & Plan Note (Signed)
Blood pressure at goal today off of his valsartan.  He is on spironolactone 25 mg daily and Coreg 6.25 mg twice daily

## 2022-08-29 NOTE — Assessment & Plan Note (Signed)
Patient down about 20 pounds since her last visit.  Congratulated him on weight loss.  He is doing a great job with diet and exercise and he will continue to work on this.  Will recheck again in 3 to 6 months.

## 2022-08-29 NOTE — Assessment & Plan Note (Signed)
Stable on Zanaflex as needed.  This is improving with weight loss as well.  Will refill Zanaflex today.  Has been tolerating well without any significant side effects.

## 2022-08-29 NOTE — Assessment & Plan Note (Signed)
Following with cardiology.  Continue current regimen per cardiology.  No signs of volume overload today.

## 2022-08-29 NOTE — Assessment & Plan Note (Signed)
A1c much improved today to 6.3.  He is doing well with Greggory Keen is doing great job with diet and exercise.  Continue Mounjaro 5 mg weekly.  Recheck A1c in 3 to 6 months.

## 2022-11-21 ENCOUNTER — Other Ambulatory Visit: Payer: Self-pay | Admitting: *Deleted

## 2022-11-21 MED ORDER — MOUNJARO 5 MG/0.5ML ~~LOC~~ SOAJ: 5.0000 mg | SUBCUTANEOUS | 3 refills | Status: DC

## 2022-12-12 ENCOUNTER — Other Ambulatory Visit (HOSPITAL_COMMUNITY): Payer: Self-pay

## 2022-12-20 ENCOUNTER — Telehealth: Payer: Self-pay

## 2022-12-20 NOTE — Telephone Encounter (Signed)
*  Primary  Pharmacy Patient Advocate Encounter   Received notification from CoverMyMeds that prior authorization for Mounjaro 5MG /0.5ML auto-injectors  is required/requested.   Insurance verification completed.   The patient is insured through [facility] .   Per test claim: PA required; PA submitted to above mentioned insurance via CoverMyMeds Key/confirmation #/EOC [id number] Status is pending

## 2022-12-23 ENCOUNTER — Other Ambulatory Visit (HOSPITAL_COMMUNITY): Payer: Self-pay

## 2022-12-23 NOTE — Telephone Encounter (Signed)
Pharmacy Patient Advocate Encounter  Received notification from [facility] that Prior Authorization for [facility] 5MG  has been APPROVED from 12/20/22 to 12/18/25. Ran test claim, Copay is $0. This test claim was processed through [facility]- copay amounts may vary at other pharmacies due to pharmacy/plan contracts, or as the patient moves through the different stages of their insurance plan.   PA #/Case ID/Reference #:  [id number]

## 2023-01-28 ENCOUNTER — Ambulatory Visit: Payer: Medicare HMO | Admitting: Urology

## 2023-01-28 ENCOUNTER — Ambulatory Visit: Payer: Medicare HMO

## 2023-01-28 DIAGNOSIS — R351 Nocturia: Secondary | ICD-10-CM

## 2023-01-28 DIAGNOSIS — R3915 Urgency of urination: Secondary | ICD-10-CM

## 2023-01-28 DIAGNOSIS — N138 Other obstructive and reflux uropathy: Secondary | ICD-10-CM

## 2023-01-28 DIAGNOSIS — R350 Frequency of micturition: Secondary | ICD-10-CM

## 2023-02-03 ENCOUNTER — Ambulatory Visit (INDEPENDENT_AMBULATORY_CARE_PROVIDER_SITE_OTHER): Payer: Medicare HMO

## 2023-02-03 VITALS — BP 150/78 | HR 71 | Temp 97.7°F | Ht 71.0 in | Wt 303.6 lb

## 2023-02-03 DIAGNOSIS — Z87891 Personal history of nicotine dependence: Secondary | ICD-10-CM

## 2023-02-03 DIAGNOSIS — Z0000 Encounter for general adult medical examination without abnormal findings: Secondary | ICD-10-CM | POA: Diagnosis not present
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
## 2023-02-03 NOTE — Addendum Note (Signed)
Addended by: Shelva Majestic on: 02/03/2023 08:15 PM   Modules accepted: Orders

## 2023-02-03 NOTE — Patient Instructions (Signed)
Edward Burton , Thank you for taking time to come for your Medicare Wellness Visit. I appreciate your ongoing commitment to your health goals. Please review the following plan we discussed and let me know if I can assist you in the future.   Referrals/Orders/Follow-Ups/Clinician Recommendations: Lose 100lbs weight by the end of the year  Aim for 30 minutes of exercise or brisk walking, 6-8 glasses of water, and 5 servings of fruits and vegetables each day.   This is a list of the screening recommended for you and due dates:  Health Maintenance  Topic Date Due   Complete foot exam   Never done   Eye exam for diabetics  Never done   Pneumococcal Vaccination (2 of 2 - PCV) 11/28/2015   Screening for Lung Cancer  09/01/2016   Zoster (Shingles) Vaccine (2 of 2) 07/05/2021   Yearly kidney health urinalysis for diabetes  01/25/2023   Hemoglobin A1C  03/01/2023   Yearly kidney function blood test for diabetes  06/10/2023   Medicare Annual Wellness Visit  02/03/2024   DTaP/Tdap/Td vaccine (2 - Td or Tdap) 12/25/2024   Colon Cancer Screening  09/03/2028   Flu Shot  Completed   COVID-19 Vaccine  Completed   Hepatitis C Screening  Completed   HIV Screening  Completed   HPV Vaccine  Aged Out    Advanced directives: (Declined) Advance directive discussed with you today. Even though you declined this today, please call our office should you change your mind, and we can give you the proper paperwork for you to fill out.  Next Medicare Annual Wellness Visit scheduled for next year: Yes

## 2023-03-03 ENCOUNTER — Ambulatory Visit (INDEPENDENT_AMBULATORY_CARE_PROVIDER_SITE_OTHER): Payer: Medicare HMO | Admitting: Family Medicine

## 2023-03-03 ENCOUNTER — Encounter: Payer: Self-pay | Admitting: Family Medicine

## 2023-03-03 VITALS — BP 133/84 | HR 78 | Temp 97.3°F | Ht 71.0 in | Wt 301.8 lb

## 2023-03-03 DIAGNOSIS — E1165 Type 2 diabetes mellitus with hyperglycemia: Secondary | ICD-10-CM

## 2023-03-03 DIAGNOSIS — I10 Essential (primary) hypertension: Secondary | ICD-10-CM

## 2023-03-03 DIAGNOSIS — J449 Chronic obstructive pulmonary disease, unspecified: Secondary | ICD-10-CM

## 2023-03-03 DIAGNOSIS — Z7985 Long-term (current) use of injectable non-insulin antidiabetic drugs: Secondary | ICD-10-CM

## 2023-03-03 DIAGNOSIS — M5414 Radiculopathy, thoracic region: Secondary | ICD-10-CM

## 2023-03-03 DIAGNOSIS — L980 Pyogenic granuloma: Secondary | ICD-10-CM | POA: Diagnosis not present

## 2023-03-03 DIAGNOSIS — R0789 Other chest pain: Secondary | ICD-10-CM

## 2023-03-03 DIAGNOSIS — L989 Disorder of the skin and subcutaneous tissue, unspecified: Secondary | ICD-10-CM

## 2023-03-03 LAB — POCT GLYCOSYLATED HEMOGLOBIN (HGB A1C): Hemoglobin A1C: 6.6 % — AB (ref 4.0–5.6)

## 2023-03-03 LAB — MICROALBUMIN / CREATININE URINE RATIO
Creatinine,U: 182.5 mg/dL
Microalb Creat Ratio: 3.8 mg/g (ref 0.0–30.0)
Microalb, Ur: <0.7 mg/dL (ref 0.0–1.9)

## 2023-03-03 MED ORDER — AZITHROMYCIN 250 MG PO TABS: ORAL_TABLET | ORAL | 0 refills | Status: DC

## 2023-03-03 MED ORDER — TIRZEPATIDE 7.5 MG/0.5ML ~~LOC~~ SOAJ: 7.5000 mg | SUBCUTANEOUS | 0 refills | Status: AC

## 2023-03-03 MED ORDER — GABAPENTIN 300 MG PO CAPS: 300.0000 mg | ORAL_CAPSULE | Freq: Two times a day (BID) | ORAL | 1 refills | Status: DC

## 2023-03-03 NOTE — Progress Notes (Signed)
 Edward Burton is a 63 y.o. male who presents today for an office visit.  Assessment/Plan:  New/Acute Problems: Skin Lesion Appearance potentially consistent with inflamed skin tag though needs biopsy.  Area was removed via tangential biopsy today.  See below procedure note.  He tolerated well.  We will send for pathology.  Chronic Problems Addressed Today: T2DM (type 2 diabetes mellitus) (HCC) A1c up slightly to 6.6.  Will increase his Mounjaro to 7.5 mg weekly.  We discussed potential side effects.  Follow-up in 3 to 6 months.  COPD (chronic obstructive pulmonary disease) (HCC) Mild flare today.  He has been consistent with his inhalers per pulmonology.  Will give Z-Pak for his flare today.  Do not think he needs prednisone burst at this point.  He will let us know if not improving.  Essential hypertension Blood pressure at goal today on valsartan 160 mg daily and carvedilol 6.25 mg twice daily.     Subjective:  HPI:  See A/P for status of chronic conditions.  Patient is here today for diabetes follow-up.  I last saw him 6 months ago.  A1c was 6.3 on Mounjaro 5 mg weekly. He is he has been working on diet and exercise.  Been consistent with medications.  No significant side effects.  He also has a skin tag on his right nipple. This has been present for years but has become more painful the last couple of weeks. Tried using a skin tag removal kit which did not work.   He has been having a cough for the last couple of days.  Worse with lying down.  Some wheezing.  No shortness of breath.  No fevers or chills.       Objective:  Physical Exam: BP 133/84   Pulse 78   Temp (!) 97.3 F (36.3 C) (Temporal)   Ht 5\' 11"  (1.803 m)   Wt (!) 301 lb 12.8 oz (136.9 kg)   SpO2 98%   BMI 42.09 kg/m   Wt Readings from Last 3 Encounters:  03/03/23 (!) 301 lb 12.8 oz (136.9 kg)  02/03/23 (!) 303 lb 9.6 oz (137.7 kg)  08/29/22 294 lb (133.4 kg)    Gen: No acute distress, resting  comfortably CV: Regular rate and rhythm with no murmurs appreciated Pulm: Normal work of breathing, clear to auscultation bilaterally with no crackles, wheezes, or rhonchi Skin: Inflamed pedunculated 4mm lesion at 5 o'clock position on right nipple Neuro: Grossly normal, moves all extremities Psych: Normal affect and thought content  Shave Biopsy Procedure Note  Pre-operative Diagnosis: Suspicious lesion  Post-operative Diagnosis: same  Locations:Right Nipple  Indications: Diagnostic  Anesthesia: Lidocaine 1% with epinephrine without added sodium bicarbonate  Procedure Details  History of allergy to iodine: no  Patient informed of the risks (including bleeding and infection) and benefits of the  procedure and Verbal informed consent obtained.  The lesion and surrounding area were given a sterile prep using alcohol and draped in the usual sterile fashion. Iris scissors were used to tangentially remove an area of skin approximately 1mm by 1mm.  Hemostasis achieved with pressure. Antibiotic ointment and a sterile dressing applied.  The specimen was sent for pathologic examination. The patient tolerated the procedure well.  EBL: 1 ml  Findings: Suspicious Lesion  Condition: Stable  Complications: none.  Plan: 1. Instructed to keep the wound dry and covered for 24-48h and clean thereafter. 2. Warning signs of infection were reviewed.   3. Recommended that the patient use OTC analgesics  as needed for pain.       Katina Degree. [name], MD 03/03/2023 9:27 AM

## 2023-03-03 NOTE — Assessment & Plan Note (Signed)
 Blood pressure at goal today on valsartan 160 mg daily and carvedilol 6.25 mg twice daily.

## 2023-03-03 NOTE — Patient Instructions (Addendum)
 It was very nice to see you today!  Your A1c is 6.6.  Please increase your Mounjaro to 7.5 mg weekly.  Let me know how this is working in a few weeks or if you have any side effects.  Will recheck your A1c in 3 to 6 months.  Please start the Z-Pak for your cough.  We removed the skin tag today.  Will contact you with the biopsy results once it comes back in a week or so.  Return in about 6 months (around 08/31/2023) for Annual Physical.   Take care, Dr [name]  PLEASE NOTE:  If you had any lab tests, please let us know if you have not heard back within a few days. You may see your results on mychart before we have a chance to review them but we will give you a call once they are reviewed by [place].   If we ordered any referrals today, please let us know if you have not heard from their office within the next week.   If you had any urgent prescriptions sent in today, please check with the pharmacy within an hour of our visit to make sure the prescription was transmitted appropriately.   Please try these tips to maintain a healthy lifestyle:  Eat at least 3 REAL meals and 1-2 snacks per day.  Aim for no more than 5 hours between eating.  If you eat breakfast, please do so within one hour of getting up.   Each meal should contain half fruits/vegetables, one quarter protein, and one quarter carbs (no bigger than a computer mouse)  Cut down on sweet beverages. This includes juice, soda, and sweet tea.   Drink at least 1 glass of water with each meal and aim for at least 8 glasses per day  Exercise at least 150 minutes every week.

## 2023-03-03 NOTE — Assessment & Plan Note (Signed)
 A1c up slightly to 6.6.  Will increase his Mounjaro to 7.5 mg weekly.  We discussed potential side effects.  Follow-up in 3 to 6 months.

## 2023-03-03 NOTE — Assessment & Plan Note (Signed)
 Mild flare today.  He has been consistent with his inhalers per pulmonology.  Will give Z-Pak for his flare today.  Do not think he needs prednisone burst at this point.  He will let us know if not improving.

## 2023-03-04 ENCOUNTER — Encounter: Payer: Self-pay | Admitting: Family Medicine

## 2023-03-04 NOTE — Progress Notes (Signed)
 Urine test is normal.  We can repeat in a year.

## 2023-03-05 LAB — DERMATOLOGY PATHOLOGY

## 2023-03-06 NOTE — Progress Notes (Signed)
 His biopsy result shows that he had a granuloma.  This is benign.  This should not cause him any issues and we do not need to do any other testing.  He should let us know if he has any further issues.

## 2023-03-07 ENCOUNTER — Other Ambulatory Visit: Payer: Self-pay | Admitting: Family Medicine

## 2023-03-07 DIAGNOSIS — J449 Chronic obstructive pulmonary disease, unspecified: Secondary | ICD-10-CM

## 2023-03-07 DIAGNOSIS — R0982 Postnasal drip: Secondary | ICD-10-CM

## 2023-03-13 ENCOUNTER — Telehealth: Payer: Self-pay

## 2023-03-13 NOTE — Telephone Encounter (Signed)
 Copied from CRM [id number]. Topic: General - Other >> Mar 13, 2023  8:10 AM Jon Gills C wrote: Reason for CRM: Patient called in regarding a missed call from the office, is requesting a callback    Called patient back and informed him of his lab results/notes. Looks as if Stella left a vm yesterday for patient to call the office back concerning lab results/notes.  Donzetta Starch, CMA

## 2023-03-13 NOTE — Telephone Encounter (Signed)
 We can send in a prednisone burst if needed.  Katina Degree. [name], MD 03/13/2023 12:57 PM

## 2023-03-13 NOTE — Telephone Encounter (Signed)
 Copied from CRM [id number]. Topic: Clinical - Medication Question >> Mar 13, 2023 10:14 AM Kathryne Eriksson wrote: Reason for CRM: Medication Call In Request >> Mar 13, 2023 10:16 AM Kathryne Eriksson wrote: Patient states he had the flu and has been taking his antibiotics. Patient's states he is still coughing and is in need of something that will help open up his airways.     Forwarding this message to Dr. [name]. Donzetta Starch, CMA

## 2023-03-14 MED ORDER — PREDNISONE 50 MG PO TABS: 50.0000 mg | ORAL_TABLET | Freq: Every day | ORAL | 0 refills | Status: DC

## 2023-03-14 NOTE — Addendum Note (Signed)
 Addended by: Jimmye Norman on: 03/14/2023 10:50 AM   Modules accepted: Orders

## 2023-03-14 NOTE — Telephone Encounter (Signed)
 Spoke to pt told him Dr. [name] said okay to send in Prednisone 50 mg daily x 5 days to the pharmacy. Pt verbalized understanding.

## 2023-03-24 ENCOUNTER — Ambulatory Visit (HOSPITAL_COMMUNITY)

## 2023-03-24 ENCOUNTER — Ambulatory Visit: Payer: Self-pay | Admitting: Family Medicine

## 2023-03-24 ENCOUNTER — Ambulatory Visit (INDEPENDENT_AMBULATORY_CARE_PROVIDER_SITE_OTHER)

## 2023-03-24 ENCOUNTER — Encounter: Payer: Self-pay | Admitting: Internal Medicine

## 2023-03-24 ENCOUNTER — Ambulatory Visit (INDEPENDENT_AMBULATORY_CARE_PROVIDER_SITE_OTHER): Admitting: Internal Medicine

## 2023-03-24 VITALS — BP 133/78 | HR 65 | Temp 99.1°F | Ht 71.0 in | Wt 305.4 lb

## 2023-03-24 DIAGNOSIS — J441 Chronic obstructive pulmonary disease with (acute) exacerbation: Secondary | ICD-10-CM

## 2023-03-24 DIAGNOSIS — R9389 Abnormal findings on diagnostic imaging of other specified body structures: Secondary | ICD-10-CM

## 2023-03-24 DIAGNOSIS — Z79899 Other long term (current) drug therapy: Secondary | ICD-10-CM

## 2023-03-24 DIAGNOSIS — R051 Acute cough: Secondary | ICD-10-CM | POA: Diagnosis not present

## 2023-03-24 DIAGNOSIS — R0602 Shortness of breath: Secondary | ICD-10-CM

## 2023-03-24 DIAGNOSIS — R6889 Other general symptoms and signs: Secondary | ICD-10-CM

## 2023-03-24 LAB — POCT INFLUENZA A/B
Influenza A, POC: NEGATIVE
Influenza B, POC: NEGATIVE

## 2023-03-24 LAB — POC COVID19 BINAXNOW: SARS Coronavirus 2 Ag: NEGATIVE

## 2023-03-24 MED ORDER — METHYLPREDNISOLONE ACETATE 80 MG/ML IJ SUSP
80.0000 mg | Freq: Once | INTRAMUSCULAR | Status: AC
Start: 2023-03-24 — End: 2023-03-24
  Administered 2023-03-24: 80 mg via INTRAMUSCULAR

## 2023-03-24 MED ORDER — COMBIVENT RESPIMAT 20-100 MCG/ACT IN AERS: 1.0000 | INHALATION_SPRAY | Freq: Four times a day (QID) | RESPIRATORY_TRACT | 11 refills | Status: AC | PRN

## 2023-03-24 MED ORDER — PREDNISONE 20 MG PO TABS: ORAL_TABLET | ORAL | 0 refills | Status: DC

## 2023-03-24 MED ORDER — DOXYCYCLINE HYCLATE 100 MG PO TABS: 100.0000 mg | ORAL_TABLET | Freq: Two times a day (BID) | ORAL | 0 refills | Status: DC

## 2023-03-24 MED ORDER — METHYLPREDNISOLONE ACETATE 80 MG/ML IJ SUSP: 80.0000 mg | Freq: Once | INTRAMUSCULAR | Status: DC

## 2023-03-24 NOTE — Progress Notes (Signed)
 ==============================  [facility]: [phone]   -- Medical Office Visit --  Patient: Edward Burton      Age: 63 y.o.       Sex:  male  Date:   03/24/2023 Today's Healthcare Provider: Lula Olszewski, MD  ==============================   CHIEF COMPLAINT: Cough (Both productive and dry. Producing white mucus, was green and brown previously.), Mild shortness of breath (For a week.), and Hoarse  Background This is a 63 y.o. male who has DOE (dyspnea on exertion); Essential hypertension; HFrEF (heart failure with reduced ejection fraction) (HCC); Dyslipidemia associated with type 2 diabetes mellitus (HCC); COPD (chronic obstructive pulmonary disease) (HCC); Allergic rhinitis; OSA (obstructive sleep apnea); T2DM (type 2 diabetes mellitus) (HCC); BPH (benign prostatic hyperplasia); Sleep-disordered breathing; Morbid obesity due to excess calories (HCC); and Recurrent low back pain on their problem list.  History of Present Illness  63 year old male with COPD, heart failure, sleep apnea, and diabetes who presents with a COPD exacerbation. It feels very similar to one just treated by his Primary Care Provider (PCP) Dr. [name]  He has a worsening cough and shortness of breath, describing it as a 'wheezy cough' that feels like 'something that's cutting my breathing off.' These symptoms began a few weeks ago and initially improved after taking a Z-Pak. However, the symptoms returned after his wife became ill, and he suspects he may have contracted the illness from her again.  His nebulizer machine is broken, and he needs to order a new one, along with more of the liquid medication for the nebulizer. He is currently using Atrovent nasal spray, last prescribed on February 28th, and has previously used steroids for similar symptoms, which have been effective in the past.  No significant swelling in his ankles, although he notes some minor swelling which  he attributes to sitting in a chair for extended periods. He has taken Lasix for fluid management and reports no significant changes in his heart failure symptoms.  He has a history of smoking, having quit approximately 19 years ago, and advises his grandchildren against smoking. He is allergic to penicillin, Bactrim, and sulfur medications. He has previously taken doxycycline without issues and is aware of the need to drink plenty of fluids to prevent esophageal irritation.  Visually reviewed that patient  has a past medical history of Allergy, Arthritis, Asthma, Atypical chest pain, Blood transfusion without reported diagnosis, Chest pain (10/03/2021), CHF (congestive heart failure) (HCC), COPD (chronic obstructive pulmonary disease) (HCC), DDD (degenerative disc disease), lumbar (12/26/2014), GERD (gastroesophageal reflux disease), HTN (hypertension), Hyperlipidemia, OSA (obstructive sleep apnea) (11/11/2017), Pneumothorax, left, Polysubstance abuse (HCC), Recurrent upper respiratory infection (URI), Tobacco abuse, Type 2 diabetes, diet controlled (HCC), and Urticaria.   Visually reviewed  Current Outpatient Medications on File Prior to Visit  Medication Sig   albuterol (VENTOLIN HFA) 108 (90 Base) MCG/ACT inhaler Inhale 2 puffs into the lungs every 6 (six) hours as needed for wheezing or shortness of breath.   aspirin 81 MG EC tablet Take 1 tablet (81 mg total) by mouth daily.   atorvastatin (LIPITOR) 40 MG tablet Take 1 tablet (40 mg total) by mouth daily.   azithromycin (ZITHROMAX) 250 MG tablet Take 2 tabs day 1, then 1 tab daily   blood glucose meter kit and supplies KIT Dispense based on patient and insurance preference. Use up to four times daily as directed. (FOR ICD-9 250.00, 250.01).   Blood Glucose Monitoring Suppl DEVI 1 each by Does  not apply route in the morning, at noon, and at bedtime. May substitute to any manufacturer covered by patient's insurance.   carvedilol (COREG) 12.5 MG  tablet Take 12.5 mg by mouth once.   cetirizine (ZYRTEC) 10 MG tablet TAKE 1 TABLET BY MOUTH DAILY FOR ALLERGIES, NOT COVER (Patient taking differently: as needed.)   furosemide (LASIX) 40 MG tablet Take 1 tablet (40 mg total) by mouth daily.   gabapentin (NEURONTIN) 300 MG capsule Take 1 capsule (300 mg total) by mouth 2 (two) times daily.   hydrocortisone 2.5 % cream Apply topically 2 (two) times daily. (Patient taking differently: Apply topically as needed.)   hydrocortisone cream 1 % Apply topically.   ipratropium (ATROVENT) 0.03 % nasal spray USE 2 SPRAYS IN EACH NOSTRIL EVERY 12 HOURS J44.9   Ipratropium-Albuterol (COMBIVENT) 20-100 MCG/ACT AERS respimat Inhale 1 puff into the lungs every 6 (six) hours as needed for wheezing.   ipratropium-albuterol (DUONEB) 0.5-2.5 (3) MG/3ML SOLN INHALE 1 VIAL VIA NEBULIZER EVERY 4 HOURS AS NEEDED   lidocaine (LIDODERM) 5 % Place 1 patch onto the skin daily. Remove & Discard patch within 12 hours or as directed by MD   montelukast (SINGULAIR) 10 MG tablet Take 1 tablet (10 mg total) by mouth at bedtime.   ONETOUCH VERIO test strip USE 1 EACH BY IN VITRO ROUTE IN THE MORNING, AT NOON, AND AT BEDTIME. MAY SUBSTITUTE TO ANY MANUFACTURER COVERED BY PATIENT'S INSURANCE.   Sodium Chloride-Sodium Bicarb (CLASSIC NETI POT SINUS WASH) 2300-700 MG KIT Place 1 spray into the nose 2 (two) times a day.   solifenacin (VESICARE) 10 MG tablet Take 1 tablet (10 mg total) by mouth daily.   tirzepatide (MOUNJARO) 7.5 MG/0.5ML Pen Inject 7.5 mg into the skin once a week.   tiZANidine (ZANAFLEX) 4 MG tablet TAKE 1 TABLET BY MOUTH EVERY 6 HOURS AS NEEDED FOR MUSLCE SPASMS   valsartan (DIOVAN) 160 MG tablet Take 160 mg by mouth daily.   No current facility-administered medications on file prior to visit.   Medications Discontinued During This Encounter  Medication Reason   predniSONE (DELTASONE) 50 MG tablet    methylPREDNISolone acetate (DEPO-MEDROL) injection 80 mg              03/24/2023    2:18 PM 03/24/2023    2:06 PM 03/03/2023    8:31 AM  Vitals with BMI  Height  5\' 11"  5\' 11"   Weight  305 lbs 6 oz 301 lbs 13 oz  BMI  42.61 42.11  Systolic 133 146 643  Diastolic 78 82 84  Pulse  65 78   Wt Readings from Last 10 Encounters:  03/24/23 (!) 305 lb 6.4 oz (138.5 kg)  03/03/23 (!) 301 lb 12.8 oz (136.9 kg)  02/03/23 (!) 303 lb 9.6 oz (137.7 kg)  08/29/22 294 lb (133.4 kg)  07/17/22 300 lb (136.1 kg)  06/10/22 (!) 316 lb (143.3 kg)  04/25/22 (!) 322 lb 12.8 oz (146.4 kg)  03/13/22 (!) 327 lb 6.4 oz (148.5 kg)  01/24/22 (!) 317 lb (143.8 kg)  01/17/22 (!) 318 lb (144.2 kg)   Vital signs reviewed.  Nursing notes reviewed. Weight trend reviewed. Physical Exam  Physical Exam CHEST: Lungs with mild wheezing and prolonged expiration. S1 and s2 heart sounds have regular rate and rhythm. Frequent long wheezy coughs but on auscultation there is little air movement during expiration to wheeze. Mallampati too poor to see oropharynx. General Appearance:  No acute distress appreciable.   Well-groomed,  healthy-appearing male.  Well proportioned with no abnormal fat distribution.  Good muscle tone. Pulmonary:  Normal work of breathing at rest, no respiratory distress apparent. SpO2: 96 %  Musculoskeletal: All extremities are intact.  Neurological:  Awake, alert, oriented, and engaged.  No obvious focal neurological deficits or cognitive impairments.  Sensorium seems unclouded.   Speech is clear and coherent with logical content. Psychiatric:  Appropriate mood, pleasant and cooperative demeanor, thoughtful and engaged during the exam}  rine  Serology  Results Review (optional):1} Results for orders placed or performed in visit on 03/24/23  POC COVID-19  Result Value Ref Range   SARS Coronavirus 2 Ag Negative Negative  POCT Influenza A/B  Result Value Ref Range   Influenza A, POC Negative Negative   Influenza B, POC Negative Negative   Office Visit on  03/24/2023  Component Date Value   SARS Coronavirus 2 Ag 03/24/2023 Negative    Influenza A, POC 03/24/2023 Negative    Influenza B, POC 03/24/2023 Negative   Office Visit on 03/03/2023  Component Date Value   Hemoglobin A1C 03/03/2023 6.6 (A)    Microalb, Ur 03/03/2023 <0.7    Creatinine,U 03/03/2023 182.5    Microalb Creat Ratio 03/03/2023 3.8    DERMATOLOGY PATHOLOGY 03/03/2023                     Value:DERMATOLOGY PATHOLOGY [facility] [address], [address] [place], [place] [address] Telephone [phone] or [phone] Fax [phone]  REPORT OF DERMATOPATHOLOGY   Accession #: [id number] Patient Name: MACIO, KISSOON Visit # : [id number]  MRN: [id number] Cytotechnologist: Zada Girt., Dermatopathologist, Electronic Signature DOB/Age [birth year] (Age: 68) Gender: M Collected Date: 03/03/2023 Received Date: 03/04/2023  FINAL DIAGNOSIS       1. Skin, right nipple :       PYOGENIC GRANULOMA       DATE SIGNED OUT: 03/05/2023 ELECTRONIC SIGNATURE : Geryl Councilman, Janie Morning., Dermatopathologist, Electronic Signature  MICROSCOPIC DESCRIPTION 1. There is a lobular proliferation of vascular spaces in the dermis resembling exuberant granulation tissue.  Some of the endothelial cells are plump, but pleomorphism and atypia are not present.  These features correlate with a pyogenic granuloma.  CASE COMMENTS STAINS USED IN DIAGNOS                         IS: H&E    CLINICAL HISTORY  SPECIMEN(S) OBTAINED 1. Skin, Right Nipple  SPECIMEN COMMENTS: 1. Pedunculated lesion SPECIMEN CLINICAL INFORMATION:    Gross Description 1. Formalin fixed specimen received:  2 X 1 X 6 MM, TOTO (1 P) (1 B) ( QB )        Report signed out from the following location(s) [place] [address], [place] [address] CLIA #: [id number]  [facility] [address] [place], [place] [address] CLIA #:  [id number]   Office Visit on 08/29/2022  Component Date Value   Hemoglobin A1C 08/29/2022 6.3 (A)   Office Visit on 06/10/2022  Component Date Value   Glucose 06/10/2022 112 (H)    BUN 06/10/2022 10    Creatinine, Ser 06/10/2022 0.86    eGFR 06/10/2022 99    BUN/Creatinine Ratio 06/10/2022 12    Sodium 06/10/2022 141    Potassium 06/10/2022 4.3    Chloride 06/10/2022 103    CO2 06/10/2022 23    Calcium 06/10/2022 9.1    BNP  06/10/2022 4.6    WBC 06/10/2022 6.0    RBC 06/10/2022 4.89    Hemoglobin 06/10/2022 13.2    Hematocrit 06/10/2022 41.0    MCV 06/10/2022 84    MCH 06/10/2022 27.0    MCHC 06/10/2022 32.2    RDW 06/10/2022 12.4    Platelets 06/10/2022 222    Neutrophils 06/10/2022 58    Lymphs 06/10/2022 31    Monocytes 06/10/2022 9    Eos 06/10/2022 2    Basos 06/10/2022 0    Neutrophils Absolute 06/10/2022 3.4    Lymphocytes Absolute 06/10/2022 1.9    Monocytes Absolute 06/10/2022 0.5    EOS (ABSOLUTE) 06/10/2022 0.1    Basophils Absolute 06/10/2022 0.0    Immature Granulocytes 06/10/2022 0    Immature Grans (Abs) 06/10/2022 0.0    IgE (Immunoglobulin E), * 06/10/2022 307   Office Visit on 04/25/2022  Component Date Value   Hemoglobin A1C 04/25/2022 11.0 (A)   No image results found. No results found.     Results for orders placed or performed in visit on 03/24/23  POCT Influenza A/B   Collection Time: 03/24/23  3:00 PM  Result Value Ref Range   Influenza A, POC Negative Negative   Influenza B, POC Negative Negative  POC COVID-19   Collection Time: 03/24/23  3:01 PM  Result Value Ref Range   SARS Coronavirus 2 Ag Negative Negative      Personally reviewed and interpreted today CXR:   there is hilar fullness and nodule.  Degenerative changes of the spine.  No pneumothorax or pleural effusion. Coarsened interstitial markings, with no confluent airspace disease.  there do appear to be some areas of decreased density in the lower thoracic vertebrae that  could potentially represent lytic lesions. These areas appear as patches of relative lucency (darker areas) within the vertebral bodies, particularly in what looks like T9-T12 region on the lateral view. Compared to the baseline, these areas of decreased density may be more prominent or new, though the image quality and positioning make it challenging to be definitive will ask radiologist to review and comment on it.   Prior 06/2022 CXR below compared     Assessment & Plan COPD exacerbation (HCC) He presents with a COPD exacerbation, showing wheezy cough, dyspnea, and throat tightness, likely due to a viral infection. Flu and COVID tests are negative, and he recently completed azithromycin. His smoking history contributes to emphysema. Past exacerbations improved with steroids. Steroids and nebulizers were discussed, with potential benefits noted. Doxycycline is prescribed for bacterial coverage not addressed by azithromycin, with hydration advised to prevent esophageal irritation. Administer methylprednisolone 80 mg injection. Prescribe prednisone, doxycycline, Atrovent nasal spray, and Combivent Respimat. Order a chest x-ray to rule out pneumonia. Provide a nebulizer machine and liquid medication.\  Heart failure is managed with Lasix, and no significant edema is reported. Current symptoms are not primarily due to heart failure exacerbation. Acute cough  Flu-like symptoms  Shortness of breath  Uses nebulizer and inhaler at home  Abnormal CXR Will bring my concern(s) about lower thoracic spine lucencies to radiologists and pcp. Patient didn't report any pain today. He does have history smoking.he didn't schedule 1 week follow up with Primary Care Provider (PCP)  as recommended prior to departure.       Orders Placed During this Encounter:   Orders Placed This Encounter  Procedures   For home use only DME Nebulizer machine    Patient needs a nebulizer to treat with the following  condition:   COPD (chronic obstructive pulmonary disease) (HCC) [562130]    Length of Need:   Lifetime   DG Chest 2 View    Reason for Exam (SYMPTOM  OR DIAGNOSIS REQUIRED):   cough    Preferred imaging location?:   [facility]   POC COVID-19    Previously tested for COVID-19:   No    Resident in a congregate (group) care setting:   Unknown    Employed in healthcare setting:   Unknown   POCT Influenza A/B   Meds ordered this encounter  Medications   predniSONE (DELTASONE) 20 MG tablet    Sig: Take 2 pills for 3 days, 1 pill for 4 days    Dispense:  10 tablet    Refill:  0   doxycycline (VIBRA-TABS) 100 MG tablet    Sig: Take 1 tablet (100 mg total) by mouth 2 (two) times daily. Take full 12 oz water with each pill to rinse it all the way down    Dispense:  20 tablet    Refill:  0   Ipratropium-Albuterol (COMBIVENT RESPIMAT) 20-100 MCG/ACT AERS respimat    Sig: Inhale 1 puff into the lungs every 6 (six) hours as needed for wheezing.    Dispense:  4 g    Refill:  11   DISCONTD: methylPREDNISolone acetate (DEPO-MEDROL) injection 80 mg   methylPREDNISolone acetate (DEPO-MEDROL) injection 80 mg        **This document was synthesized by artificial intelligence (Abridge) using HIPAA-compliant recording of the clinical interaction;   We discussed the use of AI scribe software for clinical note transcription with the patient, who gave verbal consent to proceed.    Additional Info: This encounter employed state-of-the-art, real-time, collaborative documentation. The patient actively reviewed and assisted in updating their electronic medical record on a shared screen, ensuring transparency and facilitating joint problem-solving for the problem list, overview, and plan. This approach promotes accurate, informed care. The treatment plan was discussed and reviewed in detail, including medication safety, potential side effects, and all patient questions. We confirmed understanding and  comfort with the plan. Follow-up instructions were established, including contacting the office for any concerns, returning if symptoms worsen, persist, or new symptoms develop, and precautions for potential emergency department visits.

## 2023-03-24 NOTE — Telephone Encounter (Signed)
 1st attempt. Call unanswered, message left to return call to the office to speak with nurse triage.    Message from Isabell A sent at 03/24/2023  8:42 AM EDT  Copied From CRM [id number]. Reason for Triage: Patient has a cold with a cough that wont go away - feels like a sinus infection, requesting medication.  Callback number: [phone]

## 2023-03-24 NOTE — Patient Instructions (Signed)
 VISIT SUMMARY:  Edward Burton, during your visit today, we addressed your worsening COPD symptoms, including a wheezy cough and shortness of breath. We also reviewed your heart failure, sleep apnea, and diabetes management. You received a steroid injection and were prescribed medications to help manage your symptoms. We discussed the need for a new nebulizer machine and sleep apnea device. A chest x-ray was ordered to rule out pneumonia.  YOUR PLAN:  -COPD EXACERBATION: A COPD exacerbation is a worsening of your chronic lung disease symptoms, often triggered by infections. You received a steroid injection and were prescribed prednisone, doxycycline, Atrovent nasal spray, and Combivent Respimat. A chest x-ray was ordered to rule out pneumonia. Please ensure you stay hydrated to prevent esophageal irritation from doxycycline.  -HEART FAILURE: Heart failure means your heart doesn't pump blood as well as it should. Your condition is managed with Lasix, and no significant swelling was noted today. Your current symptoms are not primarily due to heart failure exacerbation.  -SLEEP APNEA: Sleep apnea is a condition where your breathing stops and starts during sleep. You mentioned your sleep apnea machine is broken and needs replacement, which is important for your overall health.  -TYPE 2 DIABETES: Type 2 diabetes is a condition that affects how your body processes blood sugar. There were no specific issues discussed during this visit, but it remains an important part of your overall health management.  -GENERAL HEALTH MAINTENANCE: You are advised to avoid smoking and continue educating your grandchildren about the risks of smoking. Focus on lifestyle changes to prevent further health deterioration.  INSTRUCTIONS:  Please follow up with Doctor Edward Burton next week for ongoing management of your conditions. Ensure you order a new nebulizer machine and sleep apnea device as soon as possible. Stay hydrated,  especially while taking doxycycline, and get the chest x-ray done to rule out pneumonia.  It was a pleasure seeing you today! Your health and satisfaction are our top priorities.  Edward Hew, MD  Your Providers PCP: Edward Dark, MD,  [phone]) Referring Provider: Ardith Dark, MD,  [phone])     NEXT STEPS: [x]  Early Intervention: Schedule sooner appointment, call our on-call services, or go to emergency room if there is any significant Increase in pain or discomfort New or worsening symptoms Sudden or severe changes in your health [x]  Flexible Follow-Up: We recommend a No follow-ups on file. for optimal routine care. This allows for progress monitoring and treatment adjustments. [x]  Preventive Care: Schedule your annual preventive care visit! It's typically covered by insurance and helps identify potential health issues early. [x]  Lab & X-ray Appointments: Incomplete tests scheduled today, or call to schedule. X-rays: [facility] (M-F, 8:30am-noon or 1pm-5pm). [x]  Medical Information Release: Sign a release form at front desk to obtain relevant medical information we don't have.  MAKING THE MOST OF OUR FOCUSED 20 MINUTE APPOINTMENTS: [x]   Clearly state your top concerns at the beginning of the visit to focus our discussion [x]   If you anticipate you will need more time, please inform the front desk during scheduling - we can book multiple appointments in the same week. [x]   If you have transportation problems- use our convenient video appointments or ask about transportation support. [x]   We can get down to business faster if you use MyChart to update information before the visit and submit non-urgent questions before your visit. Thank you for taking the time to provide details through MyChart.  Let our nurse know and she can import  this information into your encounter documents.  Arrival and Wait Times: [x]   Arriving on time ensures that everyone  receives prompt attention. [x]   Early morning (8a) and afternoon (1p) appointments tend to have shortest wait times. [x]   Unfortunately, we cannot delay appointments for late arrivals or hold slots during phone calls.  Getting Answers and Following Up [x]   Simple Questions & Concerns: For quick questions or basic follow-up after your visit, reach [place] at [phone] or MyChart messaging. [x]   Complex Concerns: If your concern is more complex, scheduling an appointment might be best. Discuss this with the staff to find the most suitable option. [x]   Lab & Imaging Results: We'll contact you directly if results are abnormal or you don't use MyChart. Most normal results will be on MyChart within 2-3 business days, with a review message from Dr. Jon Burton. Haven't heard back in 2 weeks? Need results sooner? Contact us at [phone]. [x]   Referrals: Our referral coordinator will manage specialist referrals. The specialist's office should contact you within 2 weeks to schedule an appointment. Call us if you haven't heard from them after 2 weeks.  Staying Connected [x]   MyChart: Activate your MyChart for the fastest way to access results and message [place]. See the last page of this paperwork for instructions on how to activate.  Bring to Your Next Appointment [x]   Medications: Please bring all your medication bottles to your next appointment to ensure we have an accurate record of your prescriptions. [x]   Health Diaries: If you're monitoring any health conditions at home, keeping a diary of your readings can be very helpful for discussions at your next appointment.  Billing [x]   X-ray & Lab Orders: These are billed by separate companies. Contact the invoicing company directly for questions or concerns. [x]   Visit Charges: Discuss any billing inquiries with our administrative services team.  Your Satisfaction Matters     Chronic Obstructive Pulmonary Disease  Chronic obstructive pulmonary disease  (COPD) is a long-term (chronic) lung problem. When you have COPD, it can feel harder to breathe in or out. The condition may get worse over time. There are things you can do to keep yourself as healthy as possible. What are the causes? Smoking. This is the most common cause. Breathing in fumes, smoke, or chemicals for a long time. Genes that are inherited, which means they are passed down from parent to child. What are the signs or symptoms? Shortness of breath. This may happen all the time. This may get worse when you move your body. This may get worse over time. You may have times when this becomes much worse all of a sudden. These are called flare-ups or exacerbations. A long-term cough, with or without thick mucus. Wheezing. Chest tightness. Feeling tired. Not being able to do activities like you used to do. How is this diagnosed? This condition is diagnosed based on: Your medical history. A physical exam. Lung (pulmonary) function tests. You may have a test that measures the air flow out of the lungs when you breathe out. You may also have tests, including: Chest X-ray. CT scan. Blood tests. How is this treated? This condition may be treated by: Quitting smoking, if you smoke. Using oxygen. Taking medicines. These may include: Inhalers. These have medicines in them that you breathe in. Daily inhalers. These help to prevent symptoms from happening. They are usually taken every day to prevent COPD flare-ups. Quick relief inhalers. These act fast to relieve symptoms. They are used only  when needed and provide short-term relief. Other medicines that you breathe in or swallow. These may be used to open the airways, thin mucus, or treat infections. Breathing exercises to help you control or catch your breath. A mucus clearing device, if you have a lot of thick mucus. Pulmonary rehab. A place where you will learn about your condition and the best ways for you to manage  it. Surgery. Follow these instructions at home: Medicines Take your medicines as told by your health care provider. Talk to your provider before taking any cough or allergy medicines. You may need to avoid medicines that cause your lungs to be dry. Lifestyle Several times a day, wash your hands with soap and water for at least 20 seconds. If you cannot use soap and water, use hand sanitizer. This may help keep you from getting an infection. Avoid being around crowds or people who are sick. Do not smoke or use any products that contain nicotine or tobacco. If you need help quitting, ask your provider. Stay active. Learn how to pace your activity during the day. Learn how to breathe to control your stress and catch your breath. Drink enough fluid to keep your pee (urine) pale yellow, unless you have been told not to. Eat healthy foods. Eat smaller meals more often. Get enough sleep. Most adults need 7 or more hours per night. General instructions Make a COPD action plan with your provider. This helps you to know what to do if you feel worse than usual. Make sure you get all the shots, also called vaccines, that your provider recommends. Ask your provider about a flu shot and a pneumonia shot. If you need home oxygen therapy, ask your provider how often to check your oxygen level with a device called an oximeter. Keep all follow-up visits to review your COPD action plan. Your provider will want to check on your condition often to keep you healthy and out of the hospital. Contact a health care provider if: You are coughing up more mucus than usual. There is a change in the color or thickness of the mucus. It is harder to breathe than usual or you are short of breath while you are resting. You need to use your quick relief inhaler more often. You have trouble doing your normal activities such as getting dressed or walking in the house. Your skin color or fingernails turn blue. You have a  fever or chills. Get help right away if: You are short of breath and cannot: Talk in full sentences. Do normal activities. You have chest pain. You feel confused. These symptoms may be an emergency. Call 911 right away. Do not wait to see if the symptoms will go away. Do not drive yourself to the hospital. This information is not intended to replace advice given to you by your health care provider. Make sure you discuss any questions you have with your health care provider. Document Revised: 09/26/2022 Document Reviewed: 03/11/2022 Elsevier Patient Education  2024 [employer]vier Inc. [x]   Share Your Experience: We strive for your satisfaction! If you have any complaints, or preferably compliments, please let Dr. Jon Burton know directly or contact our Practice Administrators, Edwena Felty or [employer], by asking at the front desk.   Reviewing Your Records [x]   Review this early draft of your clinical encounter notes below and the final encounter summary tomorrow on MyChart after its been completed.  All orders placed so far are visible here: COPD exacerbation (HCC) -  predniSONE; Take 2 pills for 3 days, 1 pill for 4 days  Dispense: 10 tablet; Refill: 0 -     Doxycycline Hyclate; Take 1 tablet (100 mg total) by mouth 2 (two) times daily. Take full 12 oz water with each pill to rinse it all the way down  Dispense: 20 tablet; Refill: 0 -     Combivent Respimat; Inhale 1 puff into the lungs every 6 (six) hours as needed for wheezing.  Dispense: 4 g; Refill: 11 -     For home use only DME Nebulizer machine -     DG Chest 2 View -     methylPREDNISolone Acetate  Acute cough -     POC COVID-19 BinaxNow -     methylPREDNISolone Acetate  Flu-like symptoms -     POCT Influenza A/B  Shortness of breath -     POC COVID-19 BinaxNow -     methylPREDNISolone Acetate  Uses nebulizer and inhaler at home  Abnormal CXR

## 2023-03-24 NOTE — Telephone Encounter (Signed)
  Chief Complaint: Cough Symptoms: Cough, sore throat, wheezing, SOB Frequency: Ongoing for 1-2 weeks Pertinent Negatives: Patient denies fever Disposition: [] ED /[] Urgent Care (no appt availability in office) / [x] Appointment(In office/virtual)/ []  [facility]/ [] Home Care/ [] Refused Recommended Disposition /[] [facility]/ []  Follow-up with PCP Additional Notes: Patient has been having a cough, sore throat, and mild SOB. He has been taking Nyquil which helps a little bit, but he would like a prescription. Appointment scheduled for this afternoon.    Reason for Disposition  [1] MILD difficulty breathing (e.g., minimal/no SOB at rest, SOB with walking, pulse <100) AND [2] still present when not coughing  Answer Assessment - Initial Assessment Questions 1. ONSET: "When did the cough begin?"      1 or 2 weeks ago  2. SEVERITY: "How bad is the cough today?"      Constant   3. SPUTUM: "Describe the color of your sputum" (none, dry cough; clear, white, yellow, green)     White now, originally was green  4. DIFFICULTY BREATHING: "Are you having difficulty breathing?" If Yes, ask: "How bad is it?" (e.g., mild, moderate, severe)     A little bit of SOB at rest. Worse when doing activity outside  5. FEVER: "Do you have a fever?" If Yes, ask: "What is your temperature, how was it measured, and when did it start?"     No  6. OTHER SYMPTOMS: "Do you have any other symptoms?" (e.g., runny nose, wheezing, chest pain)       Sore throat (hurts when coughing), wheezing  Protocols used: Cough - Acute Productive-A-AH

## 2023-03-24 NOTE — Telephone Encounter (Signed)
 Noted Patient has and OV in our clinic today 03/24/2023

## 2023-06-11 ENCOUNTER — Other Ambulatory Visit: Payer: Self-pay | Admitting: Internal Medicine

## 2023-07-30 ENCOUNTER — Other Ambulatory Visit: Payer: Self-pay | Admitting: Family Medicine

## 2023-08-28 ENCOUNTER — Other Ambulatory Visit: Payer: Self-pay | Admitting: Family Medicine

## 2023-08-28 DIAGNOSIS — R0982 Postnasal drip: Secondary | ICD-10-CM

## 2023-08-28 DIAGNOSIS — J449 Chronic obstructive pulmonary disease, unspecified: Secondary | ICD-10-CM

## 2023-09-10 ENCOUNTER — Telehealth: Admitting: Family Medicine

## 2023-09-10 ENCOUNTER — Ambulatory Visit: Payer: Self-pay | Admitting: *Deleted

## 2023-09-10 DIAGNOSIS — B9689 Other specified bacterial agents as the cause of diseases classified elsewhere: Secondary | ICD-10-CM

## 2023-09-10 DIAGNOSIS — J019 Acute sinusitis, unspecified: Secondary | ICD-10-CM | POA: Diagnosis not present

## 2023-09-10 DIAGNOSIS — J441 Chronic obstructive pulmonary disease with (acute) exacerbation: Secondary | ICD-10-CM | POA: Diagnosis not present

## 2023-09-10 MED ORDER — DOXYCYCLINE HYCLATE 100 MG PO TABS
100.0000 mg | ORAL_TABLET | Freq: Two times a day (BID) | ORAL | 0 refills | Status: AC
Start: 2023-09-10 — End: ?

## 2023-09-10 MED ORDER — PREDNISONE 20 MG PO TABS: 20.0000 mg | ORAL_TABLET | Freq: Two times a day (BID) | ORAL | 0 refills | Status: AC

## 2023-09-10 MED ORDER — PROMETHAZINE-DM 6.25-15 MG/5ML PO SYRP: 5.0000 mL | ORAL_SOLUTION | Freq: Four times a day (QID) | ORAL | 0 refills | Status: AC | PRN

## 2023-09-10 NOTE — Progress Notes (Signed)
 Virtual Visit Consent   Edward Burton, you are scheduled for a virtual visit with a [facility] provider today. Just as with appointments in the office, your consent must be obtained to participate. Your consent will be active for this visit and any virtual visit you may have with one of our providers in the next 365 days. If you have a MyChart account, a copy of this consent can be sent to you electronically.  As this is a virtual visit, video technology does not allow for your provider to perform a traditional examination. This may limit your provider's ability to fully assess your condition. If your provider identifies any concerns that need to be evaluated in person or the need to arrange testing (such as labs, EKG, etc.), we will make arrangements to do so. Although advances in technology are sophisticated, we cannot ensure that it will always work on either your end or our end. If the connection with a video visit is poor, the visit may have to be switched to a telephone visit. With either a video or telephone visit, we are not always able to ensure that we have a secure connection.  By engaging in this virtual visit, you consent to the provision of healthcare and authorize for your insurance to be billed (if applicable) for the services provided during this visit. Depending on your insurance coverage, you may receive a charge related to this service.  I need to obtain your verbal consent now. Are you willing to proceed with your visit today? Vahe Pienta has provided verbal consent on 09/10/2023 for a virtual visit (video or telephone). Loa Lamp, FNP  Date: 09/10/2023 6:51 PM   Virtual Visit via Video Note   I, Loa Lamp, connected with  Enio Hornback  ([id number], [birth year]) on 09/10/23 at  6:45 PM EDT by a video-enabled telemedicine application and verified that I am speaking with the correct person using two identifiers.  Location: Patient: Virtual Visit Location Patient:  Home Provider: Virtual Visit Location Provider: Home Office   I discussed the limitations of evaluation and management by telemedicine and the availability of in person appointments. The patient expressed understanding and agreed to proceed.    History of Present Illness: Trinidad Petron is a 63 y.o. who identifies as a male who was assigned male at birth, and is being seen today for coughing, head congestion for over a week worsening, wheezing, sob, history of copd, no fever, sore throat, no headaches. Mucus is yellow.   HPI: HPI  Problems:  Patient Active Problem List   Diagnosis Date Noted   Recurrent low back pain 08/29/2022   Morbid obesity due to excess calories (HCC) 06/10/2022   Sleep-disordered breathing 03/13/2022   BPH (benign prostatic hyperplasia) 11/12/2019   T2DM (type 2 diabetes mellitus) (HCC) 06/23/2018   OSA (obstructive sleep apnea) 11/11/2017   Allergic rhinitis 03/24/2017   DOE (dyspnea on exertion) 09/02/2015   Essential hypertension 09/02/2015   HFrEF (heart failure with reduced ejection fraction) (HCC) 09/02/2015   COPD (chronic obstructive pulmonary disease) (HCC) 12/14/2014   Dyslipidemia associated with type 2 diabetes mellitus (HCC) 07/12/2011    Allergies:  Allergies  Allergen Reactions   Bactrim [Sulfamethoxazole-Trimethoprim] Anaphylaxis   Penicillins Other (See Comments)    BURNING SENSATION Has patient had a PCN reaction causing immediate rash, facial/tongue/throat swelling, SOB or lightheadedness with hypotension: No Has patient had a PCN reaction causing severe rash involving mucus membranes or skin necrosis: No Has patient had a PCN reaction that required  hospitalization No Has patient had a PCN reaction occurring within the last 10 years: No If all of the above answers are NO, then may proceed with Cephalosporin use.    Sulfa Antibiotics    Tomato    Medications:  Current Outpatient Medications:    predniSONE  (DELTASONE ) 20 MG tablet,  Take 1 tablet (20 mg total) by mouth 2 (two) times daily with a meal for 5 days. Stop prednisone  if glucose over 250, Disp: 10 tablet, Rfl: 0   promethazine -dextromethorphan (PROMETHAZINE -DM) 6.25-15 MG/5ML syrup, Take 5 mLs by mouth 4 (four) times daily as needed for cough., Disp: 118 mL, Rfl: 0   albuterol  (VENTOLIN  HFA) 108 (90 Base) MCG/ACT inhaler, Inhale 2 puffs into the lungs every 6 (six) hours as needed for wheezing or shortness of breath., Disp: , Rfl:    aspirin  81 MG EC tablet, Take 1 tablet (81 mg total) by mouth daily., Disp: 90 tablet, Rfl: 3   atorvastatin  (LIPITOR) 40 MG tablet, Take 1 tablet (40 mg total) by mouth daily., Disp: 90 tablet, Rfl: 3   azithromycin  (ZITHROMAX ) 250 MG tablet, Take 2 tabs day 1, then 1 tab daily, Disp: 6 each, Rfl: 0   blood glucose meter kit and supplies KIT, Dispense based on patient and insurance preference. Use up to four times daily as directed. (FOR ICD-9 250.00, 250.01)., Disp: 1 each, Rfl: 0   Blood Glucose Monitoring Suppl DEVI, 1 each by Does not apply route in the morning, at noon, and at bedtime. May substitute to any manufacturer covered by patient's insurance., Disp: 1 each, Rfl: 0   carvedilol  (COREG ) 12.5 MG tablet, Take 12.5 mg by mouth once., Disp: , Rfl:    cetirizine  (ZYRTEC ) 10 MG tablet, TAKE 1 TABLET BY MOUTH DAILY FOR ALLERGIES, NOT COVER (Patient taking differently: as needed.), Disp: 90 tablet, Rfl: 1   doxycycline  (VIBRA -TABS) 100 MG tablet, Take 1 tablet (100 mg total) by mouth 2 (two) times daily. Take full 12 oz water with each pill to rinse it all the way down, Disp: 20 tablet, Rfl: 0   furosemide  (LASIX ) 40 MG tablet, Take 1 tablet (40 mg total) by mouth daily., Disp: 90 tablet, Rfl: 0   gabapentin  (NEURONTIN ) 300 MG capsule, Take 1 capsule (300 mg total) by mouth 2 (two) times daily., Disp: 180 capsule, Rfl: 1   hydrocortisone  2.5 % cream, Apply topically 2 (two) times daily. (Patient taking differently: Apply topically as  needed.), Disp: 30 g, Rfl: 0   hydrocortisone  cream 1 %, Apply topically., Disp: , Rfl:    ipratropium (ATROVENT ) 0.03 % nasal spray, USE 2 SPRAYS IN EACH NOSTRIL EVERY 12 HOURS J44.9, Disp: 90 mL, Rfl: 1   Ipratropium-Albuterol  (COMBIVENT  RESPIMAT) 20-100 MCG/ACT AERS respimat, Inhale 1 puff into the lungs every 6 (six) hours as needed for wheezing., Disp: 4 g, Rfl: 11   Ipratropium-Albuterol  (COMBIVENT ) 20-100 MCG/ACT AERS respimat, Inhale 1 puff into the lungs every 6 (six) hours as needed for wheezing., Disp: 4 g, Rfl: 5   ipratropium-albuterol  (DUONEB) 0.5-2.5 (3) MG/3ML SOLN, INHALE 1 VIAL VIA NEBULIZER EVERY 4 HOURS AS NEEDED, Disp: 270 mL, Rfl: 1   lidocaine  (LIDODERM ) 5 %, Place 1 patch onto the skin daily. Remove & Discard patch within 12 hours or as directed by MD, Disp: 30 patch, Rfl: 0   montelukast  (SINGULAIR ) 10 MG tablet, Take 1 tablet (10 mg total) by mouth at bedtime., Disp: 90 tablet, Rfl: 3   ONETOUCH VERIO test strip, USE 1 EACH  BY IN VITRO ROUTE IN THE MORNING, AT NOON, AND AT BEDTIME. MAY SUBSTITUTE TO ANY MANUFACTURER COVERED BY PATIENT'S INSURANCE., Disp: 100 strip, Rfl: 0   predniSONE  (DELTASONE ) 20 MG tablet, Take 2 pills for 3 days, 1 pill for 4 days, Disp: 10 tablet, Rfl: 0   Sodium Chloride -Sodium Bicarb (CLASSIC NETI POT SINUS WASH) 2300-700 MG KIT, Place 1 spray into the nose 2 (two) times a day., Disp: 1 each, Rfl: 0   solifenacin  (VESICARE ) 10 MG tablet, Take 1 tablet (10 mg total) by mouth daily., Disp: 30 tablet, Rfl: 11   tirzepatide  (MOUNJARO ) 7.5 MG/0.5ML Pen, Inject 7.5 mg into the skin once a week., Disp: 6 mL, Rfl: 0   tiZANidine  (ZANAFLEX ) 4 MG tablet, TAKE 1 TABLET BY MOUTH EVERY 6 HOURS AS NEEDED FOR MUSLCE SPASMS, Disp: 30 tablet, Rfl: 0   valsartan  (DIOVAN ) 160 MG tablet, TAKE 1 TABLET BY MOUTH EVERY DAY, Disp: 90 tablet, Rfl: 3  Observations/Objective: Patient is well-developed, well-nourished in no acute distress.  Resting comfortably  at home.   Head is normocephalic, atraumatic.  No labored breathing.  Speech is clear and coherent with logical content.  Patient is alert and oriented at baseline.    Assessment and Plan: 1. COPD exacerbation (HCC) (Primary) - doxycycline  (VIBRA -TABS) 100 MG tablet; Take 1 tablet (100 mg total) by mouth 2 (two) times daily. Take full 12 oz water with each pill to rinse it all the way down  Dispense: 20 tablet; Refill: 0  2. Acute bacterial sinusitis  Increase fluids, humidifier at night, tylenol , UC as needed.   Follow Up Instructions: I discussed the assessment and treatment plan with the patient. The patient was provided an opportunity to ask questions and all were answered. The patient agreed with the plan and demonstrated an understanding of the instructions.  A copy of instructions were sent to the patient via MyChart unless otherwise noted below.     The patient was advised to call back or seek an in-person evaluation if the symptoms worsen or if the condition fails to improve as anticipated.    Nawaf Strange, FNP

## 2023-09-10 NOTE — Patient Instructions (Signed)
 Chronic Obstructive Pulmonary Disease Exacerbation  Chronic obstructive pulmonary disease (COPD) is a long-term (chronic) lung problem. When you have COPD, it can feel harder to breathe in or out. COPD exacerbation is a flare-up of symptoms when breathing gets worse and more treatment may be needed. Without treatment, flare-ups can be life-threatening. If they happen often, your lungs can become more damaged. What are the causes? Not taking your usual COPD medicines as told by your health care provider. A cold or the flu, which can cause infection in your lungs. Being exposed to things that make your breathing worse, such as: Smoke. Air pollution. Fumes. Dust. Allergies. Weather changes. What are the signs or symptoms? Symptoms do not get better or get worse even if you take your medicines as told by your provider. Symptoms may include: More shortness of breath. You may only be able to speak one or two words at a time. More coughing or mucus from your lungs. More wheezing or chest tightness. Being more tired and having less energy. Confusion. How is this diagnosed? This condition is diagnosed based on: Symptoms that get worse. Your medical history. A physical exam. You may also have tests, including: A chest X-ray. Blood or mucus tests. How is this treated? You may be able to stay home or you may need to go to the hospital. Treatment may include: Taking medicines. These may include: Inhalers. These have medicines in them that you breathe in. These may be more of what you already take or they may be new. Steroids. These reduce inflammation in the airways. These may be inhaled, taken by mouth, or given in an IV. Antibiotics. These treat infection. Using oxygen. Using a device to help you clear mucus. Follow these instructions at home: Medicines Take your medicines only as told by your provider. If you were given antibiotics or steroids, take them as told by your provider. Do  not stop taking them even if you start to feel better. Lifestyle Several times a day, wash your hands with soap and water for at least 20 seconds. If you cannot use soap and water, use hand sanitizer. This may help keep you from getting an infection. Avoid being around crowds or people who are sick. Do not smoke or use any products that contain nicotine or tobacco. If you need help quitting, ask your provider. Return to your normal activities when your provider says that it's safe. Use breathing methods to control your stress and catch your breath. How is this prevented? Follow your COPD action plan. The action plan tells you what to do if you're feeling good and what to do when you start feeling worse. Discuss the plan often with your provider. Make sure you get all the shots, also called vaccines, that your provider recommends. Ask your provider about a flu shot and a pneumonia shot. Use oxygen therapy if told by your provider. If you need home oxygen therapy, ask your provider how often to check your oxygen level with a device called an oximeter. Keep all follow-up visits to review your COPD action plan. Your provider will want to check on your condition often to keep you healthy and out of the hospital. Contact a health care provider if: Your COPD symptoms get worse. You have a fever or chills. You have trouble doing daily activities. You have trouble breathing even when you are resting. Get help right away if: You are short of breath and cannot: Talk in full sentences. Do normal activities. You have chest  pain. You feel confused. These symptoms may be an emergency. Call 911 right away. Do not wait to see if the symptoms will go away. Do not drive yourself to the hospital. This information is not intended to replace advice given to you by your health care provider. Make sure you discuss any questions you have with your health care provider. Document Revised: 09/26/2022 Document  Reviewed: 03/11/2022 Elsevier Patient Education  2024 [employer].

## 2023-09-10 NOTE — Telephone Encounter (Signed)
 FYI Only or Action Required?: FYI only for provider.  Patient was last seen in primary care on 03/24/2023 by Jesus Bernardino MATSU, MD.  Called Nurse Triage reporting Cough (Cough, SOB).  Symptoms began a week ago.  Interventions attempted: Nothing.  Symptoms are: gradually worsening.  Triage Disposition: See Physician Within 24 Hours  Patient/caregiver understands and will follow disposition?: yes- [facility] appointment scheduled- patient leaving town in am  Reason for Disposition  SEVERE coughing spells (e.g., whooping sound after coughing, vomiting after coughing)  Answer Assessment - Initial Assessment Questions 1. ONSET: When did the cough begin?      Started 1 week ago 2. SEVERITY: How bad is the cough today?      Causing SOB with exertion 3. SPUTUM: Describe the color of your sputum (e.g., none, dry cough; clear, white, yellow, green)     Clear/brownish,thick 4. HEMOPTYSIS: Are you coughing up any blood? If Yes, ask: How much? (e.g., flecks, streaks, tablespoons, etc.)     no 5. DIFFICULTY BREATHING: Are you having difficulty breathing? If Yes, ask: How bad is it? (e.g., mild, moderate, severe)      Only with exertion 6. FEVER: Do you have a fever? If Yes, ask: What is your temperature, how was it measured, and when did it start?     Yes- not currently- last week 7. CARDIAC HISTORY: Do you have any history of heart disease? (e.g., heart attack, congestive heart failure)      no 8. LUNG HISTORY: Do you have any history of lung disease?  (e.g., pulmonary embolus, asthma, emphysema)     COPD 9. PE RISK FACTORS: Do you have a history of blood clots? (or: recent major surgery, recent prolonged travel, bedridden)     no 10. OTHER SYMPTOMS: Do you have any other symptoms? (e.g., runny nose, wheezing, chest pain)       Runny  nose  12. TRAVEL: Have you traveled out of the country in the last month? (e.g., travel history, exposures)       Exposure at ball  game  Protocols used: Cough - Acute Productive-A-AH   Copied from CRM [id number]. Topic: Clinical - Red Word Triage >> Sep 10, 2023  4:02 PM Frederich PARAS wrote: [facility] that prompted transfer to Nurse Triage: shortness of breath  pt got a cold, normally gets antiobiotics for it, caughing and shortness of breath

## 2023-09-18 ENCOUNTER — Encounter (HOSPITAL_BASED_OUTPATIENT_CLINIC_OR_DEPARTMENT_OTHER): Payer: Self-pay | Admitting: *Deleted

## 2023-09-18 ENCOUNTER — Ambulatory Visit (INDEPENDENT_AMBULATORY_CARE_PROVIDER_SITE_OTHER): Admitting: Family Medicine

## 2023-09-18 ENCOUNTER — Emergency Department (HOSPITAL_BASED_OUTPATIENT_CLINIC_OR_DEPARTMENT_OTHER)
Admission: EM | Admit: 2023-09-18 | Discharge: 2023-09-18 | Disposition: A | Discharge status: Home / Self Care | Source: Ambulatory Visit

## 2023-09-18 ENCOUNTER — Encounter: Payer: Self-pay | Admitting: Family Medicine

## 2023-09-18 ENCOUNTER — Ambulatory Visit: Payer: Self-pay

## 2023-09-18 ENCOUNTER — Emergency Department (HOSPITAL_BASED_OUTPATIENT_CLINIC_OR_DEPARTMENT_OTHER)

## 2023-09-18 ENCOUNTER — Other Ambulatory Visit: Payer: Self-pay

## 2023-09-18 VITALS — BP 138/76 | HR 76 | Temp 97.9°F | Ht 71.0 in | Wt 313.0 lb

## 2023-09-18 DIAGNOSIS — J4489 Other specified chronic obstructive pulmonary disease: Secondary | ICD-10-CM | POA: Diagnosis not present

## 2023-09-18 DIAGNOSIS — Z7952 Long term (current) use of systemic steroids: Secondary | ICD-10-CM | POA: Diagnosis not present

## 2023-09-18 DIAGNOSIS — Z79899 Other long term (current) drug therapy: Secondary | ICD-10-CM | POA: Insufficient documentation

## 2023-09-18 DIAGNOSIS — I509 Heart failure, unspecified: Secondary | ICD-10-CM | POA: Insufficient documentation

## 2023-09-18 DIAGNOSIS — Z7982 Long term (current) use of aspirin: Secondary | ICD-10-CM | POA: Insufficient documentation

## 2023-09-18 DIAGNOSIS — R1031 Right lower quadrant pain: Secondary | ICD-10-CM | POA: Diagnosis present

## 2023-09-18 DIAGNOSIS — I110 Hypertensive heart disease with heart failure: Secondary | ICD-10-CM | POA: Diagnosis not present

## 2023-09-18 LAB — COMPREHENSIVE METABOLIC PANEL WITH GFR
ALT: 17 U/L (ref 0–44)
AST: 21 U/L (ref 15–41)
Albumin: 4.1 g/dL (ref 3.5–5.0)
Alkaline Phosphatase: 64 U/L (ref 38–126)
Anion gap: 9 (ref 5–15)
BUN: 10 mg/dL (ref 8–23)
CO2: 27 mmol/L (ref 22–32)
Calcium: 9.2 mg/dL (ref 8.9–10.3)
Chloride: 103 mmol/L (ref 98–111)
Creatinine, Ser: 0.94 mg/dL (ref 0.61–1.24)
GFR, Estimated: >60 mL/min (ref >60)
Glucose, Bld: 113 mg/dL — ABNORMAL HIGH (ref 70–99)
Potassium: 4.2 mmol/L (ref 3.5–5.1)
Sodium: 140 mmol/L (ref 135–145)
Total Bilirubin: 0.3 mg/dL (ref 0.0–1.2)
Total Protein: 8 g/dL (ref 6.5–8.1)

## 2023-09-18 LAB — URINALYSIS, ROUTINE W REFLEX MICROSCOPIC
Bilirubin Urine: NEGATIVE
Glucose, UA: NEGATIVE mg/dL
Hgb urine dipstick: NEGATIVE
Ketones, ur: NEGATIVE mg/dL
Leukocytes,Ua: NEGATIVE
Nitrite: NEGATIVE
Protein, ur: NEGATIVE mg/dL
Specific Gravity, Urine: 1.022 (ref 1.005–1.030)
pH: 7 (ref 5.0–8.0)

## 2023-09-18 LAB — CBC
HCT: 40.8 % (ref 39.0–52.0)
Hemoglobin: 12.8 g/dL — ABNORMAL LOW (ref 13.0–17.0)
MCH: 27.1 pg (ref 26.0–34.0)
MCHC: 31.4 g/dL (ref 30.0–36.0)
MCV: 86.4 fL (ref 80.0–100.0)
Platelets: 242 K/uL (ref 150–400)
RBC: 4.72 MIL/uL (ref 4.22–5.81)
RDW: 14.1 % (ref 11.5–15.5)
WBC: 7.2 K/uL (ref 4.0–10.5)
nRBC: 0 % (ref 0.0–0.2)

## 2023-09-18 LAB — LIPASE, BLOOD: Lipase: 31 U/L (ref 11–51)

## 2023-09-18 MED ORDER — CYCLOBENZAPRINE HCL 10 MG PO TABS: 10.0000 mg | ORAL_TABLET | Freq: Two times a day (BID) | ORAL | 0 refills | Status: AC | PRN

## 2023-09-18 MED ORDER — ONDANSETRON 4 MG PO TBDP: 4.0000 mg | ORAL_TABLET | Freq: Three times a day (TID) | ORAL | 0 refills | Status: AC | PRN

## 2023-09-18 MED ORDER — MORPHINE SULFATE (PF) 4 MG/ML IV SOLN
4.0000 mg | Freq: Once | INTRAVENOUS | Status: AC
  Administered 2023-09-18: 4 mg via INTRAVENOUS
  Filled 2023-09-18: qty 1

## 2023-09-18 MED ORDER — CELECOXIB 200 MG PO CAPS: 200.0000 mg | ORAL_CAPSULE | Freq: Two times a day (BID) | ORAL | 0 refills | Status: AC | PRN

## 2023-09-18 MED ORDER — IOHEXOL 300 MG/ML  SOLN
100.0000 mL | Freq: Once | INTRAMUSCULAR | Status: AC | PRN
  Administered 2023-09-18: 100 mL via INTRAVENOUS

## 2023-09-18 NOTE — Discharge Instructions (Signed)
 As discussed, your CT scan appeared normal.  Your labs also were reassuring.  Will send you home with medications to treat for musculoskeletal pain.  If you develop fever, worsening pain in right lower quadrant, nausea, vomiting, please return to the emergency department as there have been cases where the initial CT scan is normal and the patient has appendicitis it is too early to tell on imaging.  Will recommend close follow-up primary care for reassessment.

## 2023-09-18 NOTE — Patient Instructions (Addendum)
-  It was a pleasure to care for you today.  -Recommend to go directly to the closes emergency department, [facility] for further evaluation of right lower abdominal pain with diarrhea and urinary symptoms. Concerned it could be a kidney stone or appendicitis due to location of pain and symptoms.

## 2023-09-18 NOTE — ED Provider Notes (Signed)
 [place] EMERGENCY DEPARTMENT AT [facility] Provider Note   CSN: [id number] Arrival date & time: 09/18/23  1124     Patient presents with: Abdominal Pain   Edward Burton is a 63 y.o. male.    Abdominal Pain   63 year old male presents emergency department complaints of right-sided abdominal pain.  Symptoms present since the last day or so.  Reports pain mainly in right lower abdomen with some radiation to his right low back.  Does report some increased urinary urgency.  Denies any fevers, chills, nausea, vomiting, change in bowel habits.  Last bowel movement this morning regular per patient.  Denies any history of abdominal surgeries.  Presents emergency department for further assessment.  Past medical history significant for hypertension, GERD, polysubstance use, COPD, asthma, arthritis, CHF with left ventricular ejection fraction 35 to 40%, OSA, hyperlipidemia  Prior to Admission medications   Medication Sig Start Date End Date Taking? Authorizing Provider  albuterol  (VENTOLIN  HFA) 108 (90 Base) MCG/ACT inhaler Inhale 2 puffs into the lungs every 6 (six) hours as needed for wheezing or shortness of breath.    [provider]  aspirin  81 MG EC tablet Take 1 tablet (81 mg total) by mouth daily. 10/29/18   Kennyth Worth HERO, MD  atorvastatin  (LIPITOR) 40 MG tablet Take 1 tablet (40 mg total) by mouth daily. 08/29/22   Kennyth Worth HERO, MD  azithromycin  (ZITHROMAX ) 250 MG tablet Take 2 tabs day 1, then 1 tab daily 03/03/23   Kennyth Worth HERO, MD  blood glucose meter kit and supplies KIT Dispense based on patient and insurance preference. Use up to four times daily as directed. (FOR ICD-9 250.00, 250.01). 07/30/18   Kennyth Worth HERO, MD  Blood Glucose Monitoring Suppl DEVI 1 each by Does not apply route in the morning, at noon, and at bedtime. May substitute to any manufacturer covered by patient's insurance. 04/25/22   Kennyth Worth HERO, MD  carvedilol  (COREG ) 12.5 MG tablet  Take 12.5 mg by mouth once.    [provider]  carvedilol  (COREG ) 6.25 MG tablet Take 6.25 mg by mouth 2 (two) times daily.    [provider]  cetirizine  (ZYRTEC ) 10 MG tablet TAKE 1 TABLET BY MOUTH DAILY FOR ALLERGIES, NOT COVER Patient taking differently: as needed. 03/08/20   Kennyth Worth HERO, MD  doxycycline  (VIBRA -TABS) 100 MG tablet Take 1 tablet (100 mg total) by mouth 2 (two) times daily. Take full 12 oz water with each pill to rinse it all the way down 09/10/23   Blair, Diane W, FNP  furosemide  (LASIX ) 40 MG tablet Take 1 tablet (40 mg total) by mouth daily. 08/29/22   Kennyth Worth HERO, MD  gabapentin  (NEURONTIN ) 300 MG capsule Take 1 capsule (300 mg total) by mouth 2 (two) times daily. 03/03/23   Kennyth Worth HERO, MD  hydrocortisone  2.5 % cream Apply topically 2 (two) times daily. Patient taking differently: Apply topically as needed. 01/30/21   Kennyth Worth HERO, MD  hydrocortisone  cream 1 % Apply topically. 11/28/14   [provider]  ipratropium (ATROVENT ) 0.03 % nasal spray USE 2 SPRAYS IN EACH NOSTRIL EVERY 12 HOURS J44.9 08/28/23   Kennyth Worth HERO, MD  Ipratropium-Albuterol  (COMBIVENT  RESPIMAT) 20-100 MCG/ACT AERS respimat Inhale 1 puff into the lungs every 6 (six) hours as needed for wheezing. 03/24/23   Jesus Bernardino MATSU, MD  Ipratropium-Albuterol  (COMBIVENT ) 20-100 MCG/ACT AERS respimat Inhale 1 puff into the lungs every 6 (six) hours as needed for wheezing. 01/24/22  Kennyth Worth HERO, MD  ipratropium-albuterol  (DUONEB) 0.5-2.5 (3) MG/3ML SOLN INHALE 1 VIAL VIA NEBULIZER EVERY 4 HOURS AS NEEDED 08/29/22   Kennyth Worth HERO, MD  lidocaine  (LIDODERM ) 5 % Place 1 patch onto the skin daily. Remove & Discard patch within 12 hours or as directed by MD 10/03/21   Jesus Bernardino MATSU, MD  montelukast  (SINGULAIR ) 10 MG tablet Take 1 tablet (10 mg total) by mouth at bedtime. 08/29/22   Kennyth Worth HERO, MD  ONETOUCH VERIO test strip USE 1 EACH BY IN VITRO ROUTE IN THE MORNING, AT NOON,  AND AT BEDTIME. MAY SUBSTITUTE TO ANY MANUFACTURER COVERED BY PATIENT'S INSURANCE. 07/30/23   Kennyth Worth HERO, MD  predniSONE  (DELTASONE ) 20 MG tablet Take 2 pills for 3 days, 1 pill for 4 days 03/24/23   Jesus Bernardino MATSU, MD  promethazine -dextromethorphan (PROMETHAZINE -DM) 6.25-15 MG/5ML syrup Take 5 mLs by mouth 4 (four) times daily as needed for cough. 09/10/23   Blair, Diane W, FNP  Sodium Chloride -Sodium Bicarb (CLASSIC NETI POT SINUS WASH) 2300-700 MG KIT Place 1 spray into the nose 2 (two) times a day. 05/16/18   Dean Clarity, MD  solifenacin  (VESICARE ) 10 MG tablet Take 1 tablet (10 mg total) by mouth daily. 01/29/22   Matilda Senior, MD  tirzepatide  (MOUNJARO ) 7.5 MG/0.5ML Pen Inject 7.5 mg into the skin once a week. 03/03/23   Kennyth Worth HERO, MD  tiZANidine  (ZANAFLEX ) 4 MG tablet TAKE 1 TABLET BY MOUTH EVERY 6 HOURS AS NEEDED FOR MUSLCE SPASMS 08/29/22   Kennyth Worth HERO, MD  valsartan  (DIOVAN ) 160 MG tablet TAKE 1 TABLET BY MOUTH EVERY DAY 06/11/23   Wert, Michael B, MD    Allergies: Bactrim [sulfamethoxazole-trimethoprim], Penicillins, Sulfa antibiotics, and Tomato    Review of Systems  Gastrointestinal:  Positive for abdominal pain.  All other systems reviewed and are negative.   Updated Vital Signs BP (!) 159/83 (BP Location: Left Arm)   Pulse 77   Temp 98.7 F (37.1 C)   Resp 17   SpO2 98%   Physical Exam Vitals and nursing note reviewed.  Constitutional:      General: He is not in acute distress.    Appearance: He is well-developed.  HENT:     Head: Normocephalic and atraumatic.  Eyes:     Conjunctiva/sclera: Conjunctivae normal.  Cardiovascular:     Rate and Rhythm: Normal rate and regular rhythm.     Heart sounds: No murmur heard. Pulmonary:     Effort: Pulmonary effort is normal. No respiratory distress.     Breath sounds: Normal breath sounds.  Abdominal:     Palpations: Abdomen is soft.     Tenderness: There is abdominal tenderness in the right lower  quadrant. There is no right CVA tenderness, left CVA tenderness or guarding.  Musculoskeletal:        General: No swelling.     Cervical back: Neck supple.  Skin:    General: Skin is warm and dry.     Capillary Refill: Capillary refill takes less than 2 seconds.  Neurological:     Mental Status: He is alert.  Psychiatric:        Mood and Affect: Mood normal.     (all labs ordered are listed, but only abnormal results are displayed) Labs Reviewed  COMPREHENSIVE METABOLIC PANEL WITH GFR - Abnormal; Notable for the following components:      Result Value   Glucose, Bld 113 (*)    All other components within normal  limits  CBC - Abnormal; Notable for the following components:   Hemoglobin 12.8 (*)    All other components within normal limits  LIPASE, BLOOD  URINALYSIS, ROUTINE W REFLEX MICROSCOPIC    EKG: None  Radiology: No results found.   Procedures   Medications Ordered in the ED  morphine  (PF) 4 MG/ML injection 4 mg (has no administration in time range)                                    Medical Decision Making Amount and/or Complexity of Data Reviewed Labs: ordered. Radiology: ordered.  Risk Prescription drug management.   This patient presents to the ED for concern of abdominal pain, this involves an extensive number of treatment options, and is a complaint that carries with it a high risk of complications and morbidity.  The differential diagnosis includes gastritis, PUD, cholecystitis, CBD pathology, SBO/LBO, volvulus, diverticulitis, appendicitis, nephrolithiasis, pyelonephritis, other   Co morbidities that complicate the patient evaluation  See HPI   Additional history obtained:  Additional history obtained from EMR External records from outside source obtained and reviewed including hospital records   Lab Tests:  I Ordered, and personally interpreted labs.  The pertinent results include: No leukocytosis.  Anemia hemoglobin of 12.8.  No  evidence of platelet abnormalities.  No electrolyte abnormalities.  No transaminitis.  No renal dysfunction.  Lipase within normal limits.  UA without abnormality   Imaging Studies ordered:  I ordered imaging studies including CT abdomen pelvis I independently visualized and interpreted imaging which showed intermediate density within decompressed bladder.  Gallbladder appears indistinct.  Normal appendix. I agree with the radiologist interpretation  Cardiac Monitoring: / EKG:  The patient was maintained on a cardiac monitor.  I personally viewed and interpreted the cardiac monitored which showed an underlying rhythm of: Sinus rhythm   Consultations Obtained:  N/a   Problem List / ED Course / Critical interventions / Medication management  Right lower abdominal pain I ordered medication including morphine    Reevaluation of the patient after these medicines showed that the patient improved I have reviewed the patients home medicines and have made adjustments as needed   Social Determinants of Health:  Polysubstance use.  Former cigarette use.   Test / Admission - Considered:  Right lower abdominal pain Vitals signs significant for hypertension. Otherwise within normal range and stable throughout visit. Laboratory/imaging studies significant for: See above  63 year old male presents emergency department complaints of right-sided abdominal pain.  Symptoms present since the last day or so.  Reports pain mainly in right lower abdomen with some radiation to his right low back.  Does report some increased urinary urgency.  Denies any fevers, chills, nausea, vomiting, change in bowel habits.  Last bowel movement this morning regular per patient.  Denies any history of abdominal surgeries.  Presents emergency department for further assessment. On exam, focal tenderness right lower quadrant of abdomen.  Labs reassuring without acute emergent process.  CT imaging with possible abnormality  bladder/and distended gallbladder but otherwise reassuring.  Serial abdominal exam showed no tenderness at all right upper quadrant Murphy sign negative; low suspicion for gallbladder etiology of symptoms.  Alk phos, AST, ALT, bilirubin normal.  UA normal; low suspicion for UTI/pyelo-.  Unsure of exact etiology of patient's abdominal pain but could be MSK related.  Will treat accordingly and recommend follow-up with primary care.  Strict return precautions discussed at  length. Worrisome signs and symptoms were discussed with the patient, and the patient acknowledged understanding to return to the ED if noticed. Patient was stable upon discharge.       Final diagnoses:  None    ED Discharge Orders     None          Silver Wonda LABOR, [place] 09/18/23 1415    Neysa Caron PARAS, DO 09/18/23 1534

## 2023-09-18 NOTE — Telephone Encounter (Signed)
 Noted

## 2023-09-18 NOTE — Progress Notes (Signed)
    Acute Office Visit   Subjective:  Patient ID: Edward Burton, male    DOB: 09/26/1960, 64 y.o.   MRN: [id number]  Chief Complaint  Patient presents with   Abdominal Pain    HPI:  Patient is present for an acute visit. Dr. Worth Kitty his patients primary care provider.   Patient is complaining right lower abd pain. Started last night, about 6pm. All of a sudden. Constant, more severe over time. When sitting, the pain decreases and with movement, pain becomes worse. Described as a sharp pain. Sometimes it will radiate to his back. Reports diarrhea with morning.   Denies fever, nausea, vomiting, or constipation.   He reports he has been having urinary symptoms for the last 2-3 days. Hard to urinate.  Review of Systems  Gastrointestinal:  Positive for abdominal pain.   See HPI above      Objective:   BP 138/76   Pulse 76   Temp 97.9 F (36.6 C) (Oral)   Ht 5' 11 (1.803 m)   Wt (!) 313 lb (142 kg)   SpO2 97%   BMI 43.65 kg/m    Physical Exam Vitals reviewed.  Constitutional:      General: He is not in acute distress.    Appearance: Normal appearance. He is obese. He is not ill-appearing, toxic-appearing or diaphoretic.     Comments: Appears in severe pain, especially when moving  Eyes:     General:        Right eye: No discharge.        Left eye: No discharge.     Conjunctiva/sclera: Conjunctivae normal.  Cardiovascular:     Rate and Rhythm: Normal rate and regular rhythm.     Heart sounds: Normal heart sounds. No murmur heard.    No friction rub. No gallop.  Pulmonary:     Effort: Pulmonary effort is normal. No respiratory distress.     Breath sounds: Normal breath sounds.  Abdominal:     General: Bowel sounds are decreased.     Palpations: Abdomen is soft.     Tenderness: There is abdominal tenderness in the right lower quadrant. There is rebound.  Musculoskeletal:        General: Normal range of motion.  Skin:    General: Skin is warm and dry.   Neurological:     General: No focal deficit present.     Mental Status: He is alert and oriented to person, place, and time. Mental status is at baseline.  Psychiatric:        Mood and Affect: Mood normal.        Behavior: Behavior normal.        Thought Content: Thought content normal.        Judgment: Judgment normal.       Assessment & Plan:  Right lower quadrant abdominal pain  -Recommend to go directly to the closes emergency department, [facility] for further evaluation of right lower abdominal pain with diarrhea and urinary symptoms. Concerned it could be a kidney stone or appendicitis due to location of pain and symptoms.  -Consulted with Dr. Johnny, supervising physician about CT scan vs. sending to ED.   Jemeka Wagler, NP

## 2023-09-18 NOTE — ED Triage Notes (Signed)
 Pt to ED reporting lower right sided abd pain started suddenly last night. Tenderness upon palpation. No NVD or fevers.

## 2023-09-18 NOTE — Telephone Encounter (Signed)
 FYI Only or Action Required?: FYI only for provider.  Patient was last seen in primary care on 09/10/2023 by Blair, Diane W, FNP.  Called Nurse Triage reporting Abdominal Pain.  Symptoms began yesterday.  Interventions attempted: Rest, hydration, or home remedies.  Symptoms are: unchanged.  Triage Disposition: See HCP Within 4 Hours (Or PCP Triage)  Patient/caregiver understands and will follow disposition?: Yes  Copied from CRM #[id number]. Topic: Clinical - Red Word Triage >> Sep 18, 2023  9:14 AM Edward Burton wrote: Edward Burton that prompted transfer to Nurse Triage: Patient states he is having bad pain in his right side. Pain started last night. Reason for Disposition  [1] MILD-MODERATE pain AND [2] constant AND [3] age > 60 years  Answer Assessment - Initial Assessment Questions 1. LOCATION: Where does it hurt?      Right lower abdominal area 2. RADIATION: Does the pain shoot anywhere else? (e.g., chest, back)     Patient states he is having pain to his back also 3. ONSET: When did the pain begin? (Minutes, hours or days ago)      yesterday 4. SUDDEN: Gradual or sudden onset?     gradual 5. PATTERN Does the pain come and go, or is it constant?     constant 6. SEVERITY: How bad is the pain?  (e.g., Scale 1-10; mild, moderate, or severe)     Pain is 6 out of 10 with moving 7. RECURRENT SYMPTOM: Have you ever had this type of stomach pain before? If Yes, ask: When was the last time? and What happened that time?      no 8. CAUSE: What do you think is causing the stomach pain? (e.g., gallstones, recent abdominal surgery)     unsure 9. RELIEVING/AGGRAVATING FACTORS: What makes it better or worse? (e.g., antacids, bending or twisting motion, bowel movement)     no 10. OTHER SYMPTOMS: Do you have any other symptoms? (e.g., back pain, diarrhea, fever, urination pain, vomiting)       Back pain, pain with urination  Protocols used: Abdominal Pain - Male-A-AH

## 2023-11-19 ENCOUNTER — Other Ambulatory Visit: Payer: Self-pay | Admitting: Family Medicine

## 2023-11-19 ENCOUNTER — Other Ambulatory Visit: Payer: Self-pay | Admitting: *Deleted

## 2023-11-19 DIAGNOSIS — R0789 Other chest pain: Secondary | ICD-10-CM

## 2023-11-19 DIAGNOSIS — M5414 Radiculopathy, thoracic region: Secondary | ICD-10-CM

## 2023-11-19 MED ORDER — GABAPENTIN 300 MG PO CAPS: 300.0000 mg | ORAL_CAPSULE | Freq: Two times a day (BID) | ORAL | 0 refills | Status: DC

## 2023-11-19 NOTE — Addendum Note (Signed)
 Addended by: IDA ELORA HERO on: 11/19/2023 01:52 PM   Modules accepted: Orders

## 2023-11-20 MED ORDER — GABAPENTIN 300 MG PO CAPS: 300.0000 mg | ORAL_CAPSULE | Freq: Two times a day (BID) | ORAL | 3 refills | Status: AC

## 2023-12-03 ENCOUNTER — Telehealth: Payer: Self-pay

## 2023-12-03 NOTE — Telephone Encounter (Signed)
 Received a fax from [facility] to inform Dr. Kennyth that beginning January 1st, 2026 they will no longer cover the one touch verio test strips. However they will cover the accu-check test strips. Please advise on changing prescription the first of the year, please and thank you.

## 2024-01-06 ENCOUNTER — Other Ambulatory Visit: Payer: Self-pay

## 2024-01-06 ENCOUNTER — Ambulatory Visit: Payer: Self-pay

## 2024-01-06 ENCOUNTER — Emergency Department (HOSPITAL_BASED_OUTPATIENT_CLINIC_OR_DEPARTMENT_OTHER)

## 2024-01-06 ENCOUNTER — Emergency Department (HOSPITAL_BASED_OUTPATIENT_CLINIC_OR_DEPARTMENT_OTHER): Admitting: Radiology

## 2024-01-06 ENCOUNTER — Emergency Department (HOSPITAL_BASED_OUTPATIENT_CLINIC_OR_DEPARTMENT_OTHER)
Admission: EM | Admit: 2024-01-06 | Discharge: 2024-01-06 | Disposition: A | Discharge status: Home / Self Care | Source: Ambulatory Visit | Attending: Emergency Medicine | Admitting: Emergency Medicine

## 2024-01-06 DIAGNOSIS — J4489 Other specified chronic obstructive pulmonary disease: Secondary | ICD-10-CM | POA: Diagnosis not present

## 2024-01-06 DIAGNOSIS — Z7982 Long term (current) use of aspirin: Secondary | ICD-10-CM | POA: Insufficient documentation

## 2024-01-06 DIAGNOSIS — M542 Cervicalgia: Secondary | ICD-10-CM | POA: Insufficient documentation

## 2024-01-06 DIAGNOSIS — I110 Hypertensive heart disease with heart failure: Secondary | ICD-10-CM | POA: Insufficient documentation

## 2024-01-06 DIAGNOSIS — Z7951 Long term (current) use of inhaled steroids: Secondary | ICD-10-CM | POA: Diagnosis not present

## 2024-01-06 DIAGNOSIS — Z79899 Other long term (current) drug therapy: Secondary | ICD-10-CM | POA: Diagnosis not present

## 2024-01-06 DIAGNOSIS — R0981 Nasal congestion: Secondary | ICD-10-CM | POA: Diagnosis not present

## 2024-01-06 DIAGNOSIS — Z794 Long term (current) use of insulin: Secondary | ICD-10-CM | POA: Insufficient documentation

## 2024-01-06 DIAGNOSIS — R42 Dizziness and giddiness: Secondary | ICD-10-CM | POA: Insufficient documentation

## 2024-01-06 DIAGNOSIS — R0789 Other chest pain: Secondary | ICD-10-CM | POA: Insufficient documentation

## 2024-01-06 DIAGNOSIS — I5020 Unspecified systolic (congestive) heart failure: Secondary | ICD-10-CM | POA: Insufficient documentation

## 2024-01-06 DIAGNOSIS — R519 Headache, unspecified: Secondary | ICD-10-CM | POA: Diagnosis not present

## 2024-01-06 LAB — CBC
HCT: 40.9 % (ref 39.0–52.0)
Hemoglobin: 13 g/dL (ref 13.0–17.0)
MCH: 27.3 pg (ref 26.0–34.0)
MCHC: 31.8 g/dL (ref 30.0–36.0)
MCV: 85.7 fL (ref 80.0–100.0)
Platelets: 214 K/uL (ref 150–400)
RBC: 4.77 MIL/uL (ref 4.22–5.81)
RDW: 13.8 % (ref 11.5–15.5)
WBC: 6.8 K/uL (ref 4.0–10.5)
nRBC: 0 % (ref 0.0–0.2)

## 2024-01-06 LAB — TROPONIN T, HIGH SENSITIVITY
Troponin T High Sensitivity: <15 ng/L (ref 0–19)
Troponin T High Sensitivity: <15 ng/L (ref 0–19)

## 2024-01-06 LAB — BASIC METABOLIC PANEL WITH GFR
Anion gap: 9 (ref 5–15)
BUN: 9 mg/dL (ref 8–23)
CO2: 28 mmol/L (ref 22–32)
Calcium: 9.5 mg/dL (ref 8.9–10.3)
Chloride: 101 mmol/L (ref 98–111)
Creatinine, Ser: 0.95 mg/dL (ref 0.61–1.24)
GFR, Estimated: >60 mL/min
Glucose, Bld: 159 mg/dL — ABNORMAL HIGH (ref 70–99)
Potassium: 4.2 mmol/L (ref 3.5–5.1)
Sodium: 138 mmol/L (ref 135–145)

## 2024-01-06 MED ORDER — MECLIZINE HCL 25 MG PO TABS: 25.0000 mg | ORAL_TABLET | Freq: Three times a day (TID) | ORAL | 0 refills | Status: AC | PRN

## 2024-01-06 MED ORDER — IOHEXOL 350 MG/ML SOLN
75.0000 mL | Freq: Once | INTRAVENOUS | Status: AC | PRN
  Administered 2024-01-06: 100 mL via INTRAVENOUS

## 2024-01-06 NOTE — ED Provider Notes (Signed)
 " [place] EMERGENCY DEPARTMENT AT [facility] Provider Note   CSN: [id number] Arrival date & time: 01/06/24  9050     Patient presents with: Chest Pain   Edward Burton is a 63 y.o. male.    Patient with history of hypertension, GERD, polysubstance use, COPD, asthma, arthritis, CHF with left ventricular ejection fraction 35 to 40%, OSA, hyperlipidemia --presents to the emergency department today for evaluation of dizziness.  Patient describes awaking in usual state of health.  He was driving this morning around 10 AM and started to feel bilateral pain in his neck.  He had associated dizziness that is described as a spinning sensation with this.  No vision change or vomiting.  Patient denies signs of stroke including: facial droop, slurred speech, aphasia, weakness/numbness in extremities, imbalance/trouble walking.  He states that after about 30 to 40 minutes of symptoms he called PCP.  Office recommended that he come to the emergency department.  Patient reports also feeling a little sensation in my chest, like a flutter.  No persistent chest pain.  Spinning has actually improved.  Patient continues to have neck pain.  He does report several days of sinus congestion.  No fevers.       Prior to Admission medications  Medication Sig Start Date End Date Taking? Authorizing Provider  albuterol  (VENTOLIN  HFA) 108 (90 Base) MCG/ACT inhaler Inhale 2 puffs into the lungs every 6 (six) hours as needed for wheezing or shortness of breath.    [provider]  aspirin  81 MG EC tablet Take 1 tablet (81 mg total) by mouth daily. 10/29/18   Kennyth Worth HERO, MD  atorvastatin  (LIPITOR) 40 MG tablet Take 1 tablet (40 mg total) by mouth daily. 08/29/22   Kennyth Worth HERO, MD  azithromycin  (ZITHROMAX ) 250 MG tablet Take 2 tabs day 1, then 1 tab daily 03/03/23   Kennyth Worth HERO, MD  blood glucose meter kit and supplies KIT Dispense based on patient and insurance preference. Use up to four  times daily as directed. (FOR ICD-9 250.00, 250.01). 07/30/18   Kennyth Worth HERO, MD  Blood Glucose Monitoring Suppl DEVI 1 each by Does not apply route in the morning, at noon, and at bedtime. May substitute to any manufacturer covered by patient's insurance. 04/25/22   Kennyth Worth HERO, MD  carvedilol  (COREG ) 12.5 MG tablet Take 12.5 mg by mouth once.    [provider]  carvedilol  (COREG ) 6.25 MG tablet Take 6.25 mg by mouth 2 (two) times daily.    [provider]  celecoxib  (CELEBREX ) 200 MG capsule Take 1 capsule (200 mg total) by mouth 2 (two) times daily as needed. 09/18/23   Silver Fell A, PA  cetirizine  (ZYRTEC ) 10 MG tablet TAKE 1 TABLET BY MOUTH DAILY FOR ALLERGIES, NOT COVER Patient taking differently: as needed. 03/08/20   Kennyth Worth HERO, MD  cyclobenzaprine  (FLEXERIL ) 10 MG tablet Take 1 tablet (10 mg total) by mouth 2 (two) times daily as needed for muscle spasms. 09/18/23   Silver Fell LABOR, PA  doxycycline  (VIBRA -TABS) 100 MG tablet Take 1 tablet (100 mg total) by mouth 2 (two) times daily. Take full 12 oz water with each pill to rinse it all the way down 09/10/23   Blair, Diane W, FNP  furosemide  (LASIX ) 40 MG tablet Take 1 tablet (40 mg total) by mouth daily. 08/29/22   Kennyth Worth HERO, MD  gabapentin  (NEURONTIN ) 300 MG capsule Take 1 capsule (300 mg total) by mouth 2 (two) times  daily. 11/20/23   Kennyth Worth HERO, MD  hydrocortisone  2.5 % cream Apply topically 2 (two) times daily. Patient taking differently: Apply topically as needed. 01/30/21   Kennyth Worth HERO, MD  hydrocortisone  cream 1 % Apply topically. 11/28/14   [provider]  ipratropium (ATROVENT ) 0.03 % nasal spray USE 2 SPRAYS IN EACH NOSTRIL EVERY 12 HOURS J44.9 08/28/23   Kennyth Worth HERO, MD  Ipratropium-Albuterol  (COMBIVENT  RESPIMAT) 20-100 MCG/ACT AERS respimat Inhale 1 puff into the lungs every 6 (six) hours as needed for wheezing. 03/24/23   Jesus Bernardino MATSU, MD  Ipratropium-Albuterol   (COMBIVENT ) 20-100 MCG/ACT AERS respimat Inhale 1 puff into the lungs every 6 (six) hours as needed for wheezing. 01/24/22   Kennyth Worth HERO, MD  ipratropium-albuterol  (DUONEB) 0.5-2.5 (3) MG/3ML SOLN INHALE 1 VIAL VIA NEBULIZER EVERY 4 HOURS AS NEEDED 08/29/22   Kennyth Worth HERO, MD  lidocaine  (LIDODERM ) 5 % Place 1 patch onto the skin daily. Remove & Discard patch within 12 hours or as directed by MD 10/03/21   Jesus Bernardino MATSU, MD  montelukast  (SINGULAIR ) 10 MG tablet Take 1 tablet (10 mg total) by mouth at bedtime. 08/29/22   Kennyth Worth HERO, MD  ondansetron  (ZOFRAN -ODT) 4 MG disintegrating tablet Take 1 tablet (4 mg total) by mouth every 8 (eight) hours as needed. 09/18/23   Silver Wonda LABOR, PA  ONETOUCH VERIO test strip USE 1 EACH BY IN VITRO ROUTE IN THE MORNING, AT NOON, AND AT BEDTIME. MAY SUBSTITUTE TO ANY MANUFACTURER COVERED BY PATIENT'S INSURANCE. 07/30/23   Kennyth Worth HERO, MD  predniSONE  (DELTASONE ) 20 MG tablet Take 2 pills for 3 days, 1 pill for 4 days 03/24/23   Jesus Bernardino MATSU, MD  promethazine -dextromethorphan (PROMETHAZINE -DM) 6.25-15 MG/5ML syrup Take 5 mLs by mouth 4 (four) times daily as needed for cough. 09/10/23   Blair, Diane W, FNP  Sodium Chloride -Sodium Bicarb (CLASSIC NETI POT SINUS WASH) 2300-700 MG KIT Place 1 spray into the nose 2 (two) times a day. 05/16/18   Dean Clarity, MD  solifenacin  (VESICARE ) 10 MG tablet Take 1 tablet (10 mg total) by mouth daily. 01/29/22   Matilda Senior, MD  tirzepatide  (MOUNJARO ) 7.5 MG/0.5ML Pen Inject 7.5 mg into the skin once a week. 03/03/23   Kennyth Worth HERO, MD  valsartan  (DIOVAN ) 160 MG tablet TAKE 1 TABLET BY MOUTH EVERY DAY 06/11/23   Wert, Michael B, MD    Allergies: Bactrim [sulfamethoxazole-trimethoprim], Penicillins, Sulfa antibiotics, and Tomato    Review of Systems  Updated Vital Signs BP 128/83 (BP Location: Right Arm)   Pulse 83   Temp 97.8 F (36.6 C) (Oral)   Resp (!) 21   SpO2 100%   Physical Exam Vitals and  nursing note reviewed.  Constitutional:      Appearance: He is well-developed. He is not diaphoretic.  HENT:     Head: Normocephalic and atraumatic.     Right Ear: Tympanic membrane, ear canal and external ear normal.     Left Ear: Tympanic membrane, ear canal and external ear normal.     Nose: Nose normal.     Mouth/Throat:     Mouth: Mucous membranes are not dry.     Pharynx: Uvula midline.  Eyes:     General: Lids are normal.     Conjunctiva/sclera: Conjunctivae normal.     Pupils: Pupils are equal, round, and reactive to light.  Neck:     Vascular: Normal carotid pulses. No carotid bruit or JVD.  Trachea: Trachea normal. No tracheal deviation.  Cardiovascular:     Rate and Rhythm: Normal rate and regular rhythm.     Pulses: No decreased pulses.          Radial pulses are 2+ on the right side and 2+ on the left side.     Heart sounds: Normal heart sounds, S1 normal and S2 normal. Heart sounds not distant. No murmur heard. Pulmonary:     Effort: Pulmonary effort is normal. No respiratory distress.     Breath sounds: Normal breath sounds. No wheezing.  Chest:     Chest wall: No tenderness.  Abdominal:     General: Bowel sounds are normal.     Palpations: Abdomen is soft.     Tenderness: There is no abdominal tenderness. There is no guarding or rebound.  Musculoskeletal:        General: Normal range of motion.     Cervical back: Normal range of motion and neck supple. Tenderness present. No bony tenderness. No muscular tenderness.     Thoracic back: No spasms. Normal range of motion.     Right lower leg: No edema.     Left lower leg: No edema.  Skin:    General: Skin is warm and dry.     Coloration: Skin is not pale.  Neurological:     Mental Status: He is alert and oriented to person, place, and time. Mental status is at baseline.     GCS: GCS eye subscore is 4. GCS verbal subscore is 5. GCS motor subscore is 6.     Cranial Nerves: No cranial nerve deficit.      Sensory: No sensory deficit.     Motor: No abnormal muscle tone.     Coordination: Coordination normal.     Gait: Gait normal.  Psychiatric:        Mood and Affect: Mood normal.     (all labs ordered are listed, but only abnormal results are displayed) Labs Reviewed  BASIC METABOLIC PANEL WITH GFR - Abnormal; Notable for the following components:      Result Value   Glucose, Bld 159 (*)    All other components within normal limits  CBC  TROPONIN T, HIGH SENSITIVITY  TROPONIN T, HIGH SENSITIVITY    ED ECG REPORT   Date: 01/06/2024  Rate: 80  Rhythm: normal sinus rhythm  QRS Axis: normal  Intervals: normal  ST/T Wave abnormalities: nonspecific T wave changes  Conduction Disutrbances:none  Narrative Interpretation:   Old EKG Reviewed: Unchanged from 01/2020 except T wave inversions are more pronounced laterally.  I have personally reviewed the EKG tracing and agree with the computerized printout as noted.   Radiology: DG Chest 2 View Result Date: 01/06/2024 EXAM: 2 VIEW(S) XRAY OF THE CHEST 01/06/2024 10:07:30 AM COMPARISON: 03/24/2023 CLINICAL HISTORY: CP FINDINGS: LUNGS AND PLEURA: No focal pulmonary opacity. No pleural effusion. No pneumothorax. HEART AND MEDIASTINUM: Calcified aorta. BONES AND SOFT TISSUES: Chronic left rib fracture deformities. Thoracic degenerative changes. IMPRESSION: 1. No acute cardiopulmonary findings. 2. Aortic atherosclerosis. Chronic left rib fracture deformities and thoracic degenerative changes. Electronically signed by: Evalene Coho MD 01/06/2024 11:41 AM EST RP Workstation: [id number]     Procedures   Medications Ordered in the ED  iohexol  (OMNIPAQUE ) 350 MG/ML injection 75 mL (100 mLs Intravenous Contrast Given 01/06/24 1215)    ED Course  Patient seen and examined. History obtained directly from patient. Work-up including labs, imaging, EKG ordered in triage, if performed, were reviewed.  Labs/EKG: Independently reviewed and  interpreted.  This included: BMP glucose 159 otherwise unremarkable; CBC unremarkable; troponin less than 15.  Imaging: Independently visualized and interpreted.  This included: Chest x-ray, no acute findings.  Medications/Fluids: None ordered  Most recent vital signs reviewed and are as follows: BP 128/83 (BP Location: Right Arm)   Pulse 83   Temp 97.8 F (36.6 C) (Oral)   Resp (!) 21   SpO2 100%   Initial impression: Patient's main complaint today is vertigo in setting of new onset of neck pain.  It sounds like he does have neck pain from time to time but not the vertigo.  He does have some risk factors for stroke.  Symptoms seem to be improving, however would like to rule out dissection in the neck which may lead to posterior circulation stroke.  Patient agreeable.  Will send second troponin.  Will continue to monitor.  2:08 PM Reassessment performed. Patient appears stable, comfortable.  He has not had any recurrent vertigo.  He denies any chest pain at current.  Labs personally reviewed and interpreted including: Second troponin was normal.  Imaging personally visualized and interpreted including: CT angio of the head and neck, did not show signs of stroke, bleeding, dissection, critical blockages.  Reviewed pertinent lab work and imaging with patient at bedside. Questions answered.   Most current vital signs reviewed and are as follows: BP 134/60   Pulse 84   Temp 97.8 F (36.6 C) (Oral)   Resp (!) 21   SpO2 100%   Plan: Discharge to home.   Prescriptions written for: Meclizine to try if dizziness returns  Other home care instructions discussed:   ED return instructions discussed: Patient counseled to return if they have weakness in their arms or legs, slurred speech, trouble walking or talking, confusion, trouble with their balance, or if they have any other concerns.   Return and follow-up instructions: I encouraged patient to return to ED with severe chest pain,  especially if the pain is crushing or pressure-like and spreads to the arms, back, neck, or jaw, or if they have associated sweating, vomiting, or shortness of breath with the pain, or significant pain with activity. We discussed that the evaluation here today indicates a low-risk of serious cause of chest pain, including heart trouble or a blood clot, but no evaluation is perfect and chest pain can evolve with time. The patient verbalized understanding and agreed.   Follow-up instructions discussed: Patient encouraged to follow-up with their PCP in 3 days.                                    Medical Decision Making Amount and/or Complexity of Data Reviewed Labs: ordered. Radiology: ordered.  Risk Prescription drug management.   In regards to the patient's headache, critical differentials were considered including subarachnoid hemorrhage, intracerebral hemorrhage, epidural/subdural hematoma, pituitary apoplexy, vertebral/carotid artery dissection, giant cell arteritis, central venous thrombosis, reversible cerebral vasoconstriction, acute angle closure glaucoma, idiopathic intracranial hypertension, bacterial meningitis, viral encephalitis, carbon monoxide poisoning, posterior reversible encephalopathy syndrome, pre-eclampsia.   Reg flag symptoms related to these causes were considered including systemic symptoms (fever, weight loss), neurologic symptoms (confusion, mental status change, vision change, associated seizure), acute or sudden thunderclap onset, patient age 8 or older with new or progressive headache, patient of any age with first headache or change in headache pattern, pregnant or postpartum status, history of HIV or other  immunocompromise, history of cancer, headache occurring with exertion, associated neck or shoulder pain, associated traumatic injury, concurrent use of anticoagulation, family history of spontaneous SAH, and concurrent drug use.    Other benign, more common  causes of headache were considered including migraine, tension-type headache, cluster headache, referred pain from other cause such as sinus infection, dental pain, trigeminal neuralgia.   On exam, patient has a reassuring neuro exam including baseline mental status, no significant neck pain or meningeal signs, no signs of severe infection or fever.   Patient did report associated dizziness described as vertigo with the headache earlier.  For this reason CTA of the head and neck were ordered.  This did not show signs of stroke, bleeding, aneurysm, dissection, obstruction of blood vessel.  Patient does have mild chest symptoms as well.  These have also resolved.  Troponin negative x 2.  EKG largely unchanged from previous.  Symptoms are atypical.  Low concern for acute ACS, PE.  No tachycardia or hypoxia.  The patient's vital signs, pertinent lab work and imaging were reviewed and interpreted as discussed in the ED course. Hospitalization was considered for further testing, treatments, or serial exams/observation. However as patient is well-appearing, has a stable exam over the course of their evaluation, and reassuring studies today, I do not feel that they warrant admission at this time. This plan was discussed with the patient who verbalizes agreement and comfort with this plan and seems reliable and able to return to the Emergency Department with worsening or changing symptoms.       Final diagnoses:  Vertigo  Acute nonintractable headache, unspecified headache type  Chest tightness    ED Discharge Orders          Ordered    meclizine (ANTIVERT) 25 MG tablet  3 times daily PRN        01/06/24 1405               Desiderio Chew, PA-C 01/06/24 1413  "

## 2024-01-06 NOTE — Discharge Instructions (Addendum)
 Please read and follow all provided instructions.  Your diagnoses today include:  1. Vertigo   2. Acute nonintractable headache, unspecified headache type   3. Chest tightness     Tests performed today include: An EKG of your heart: No signs of abnormal heart rhythm or stress on the heart A chest x-ray: No signs of pneumonia or fluid buildup Cardiac enzymes: a blood test for heart muscle damage, were both negative, no signs of heart attack or stress on the heart. Blood counts and electrolytes CT scan of the head and neck did not show any signs of stroke, bleeding inside the brain, tears in blood vessels or significant blockages in the head or neck. Vital signs. See below for your results today.   Medications prescribed:  Meclizine: Medication that can help with spinning type dizziness  Take any prescribed medications only as directed.  Follow-up instructions: Please follow-up with your primary care provider as soon as you can for further evaluation of your symptoms.   Return instructions:  SEEK IMMEDIATE MEDICAL ATTENTION IF: You have severe chest pain, especially if the pain is crushing or pressure-like and spreads to the arms, back, neck, or jaw, or if you have sweating, nausea or vomiting, or trouble with breathing. THIS IS AN EMERGENCY. Do not wait to see if the pain will go away. Get medical help at once. Call 911. DO NOT drive yourself to the hospital. Return if you have weakness in your arms or legs, slurred speech, trouble walking or talking, confusion, or trouble with your balance.  Your chest pain gets worse and does not go away after a few minutes of rest.  You have an attack of chest pain lasting longer than what you usually experience.  You have significant dizziness, if you pass out, or have trouble walking.  You have chest pain not typical of your usual pain for which you originally saw your caregiver.  You have any other emergent concerns regarding your health.  Your  vital signs today were: BP 134/60   Pulse 84   Temp 97.8 F (36.6 C) (Oral)   Resp (!) 21   SpO2 100%  If your blood pressure (BP) was elevated above 135/85 this visit, please have this repeated by your doctor within one month. --------------

## 2024-01-06 NOTE — ED Notes (Signed)
 He states his dizziness has resolved. He c/o persistent, mild occipital h/a. He denies any other pain/discomfort, including chest pain.

## 2024-01-06 NOTE — ED Triage Notes (Signed)
 Reports CP and dizziness starting this morning. Pain rads into neck, labored breathing,

## 2024-01-06 NOTE — Telephone Encounter (Signed)
 FYI Only or Action Required?: FYI only for provider: ED advised.  Patient was last seen in primary care on 09/18/2023 by Billy Philippe SAUNDERS, NP.  Called Nurse Triage reporting Palpitations, Dizziness, and Neck Pain.  Symptoms began unknown.  Interventions attempted: Nothing.  Symptoms are: unchanged.  Triage Disposition: Go to ED Now (or PCP Triage)  Patient/caregiver understands and will follow disposition?: Yes               Copied from CRM #[id number]. Topic: Clinical - Red Word Triage >> Jan 06, 2024  9:14 AM Antwanette L wrote: Red Word that prompted transfer to Nurse Triage: The patient reports dizziness, posterior neck pain, and episodes of heart fluttering. These symptoms have been present for the past three days Reason for Disposition  Extra heartbeats, irregular heart beating, or heart is beating very fast  (i.e., palpitations)  Answer Assessment - Initial Assessment Questions 1. DESCRIPTION: Describe your dizziness.     Lightheaded. 2. LIGHTHEADED: Do you feel lightheaded? (e.g., somewhat faint, woozy, weak upon standing)     Yes.  3. VERTIGO: Do you feel like either you or the room is spinning or tilting? (i.e., vertigo)     No.  Patient states just a few minutes ago he could feel his heart fluttering and he currently has dizziness lightheaded and back of head and neck pain. He states he has been experiencing SOB with activity/walking. Patient states he is currently driving. RN advised if he can pull over and have someone else drive him to the ED. He states he feels well enough to drive himself. RN advised patient to pull over and call 911 if he worsens or develops SOB, chest pain/pressure or heart fluttering returns. Denies unilateral numbness or weakness, changes in speech or vision, chest pain.  Protocols used: Dizziness - Lightheadedness-A-AH

## 2024-01-06 NOTE — Telephone Encounter (Signed)
 Noted pt going to ED for evaluation

## 2024-02-05 ENCOUNTER — Ambulatory Visit: Payer: Medicare HMO

## 2024-02-11 ENCOUNTER — Ambulatory Visit

## 2024-02-11 VITALS — BP 123/88 | Ht 71.0 in | Wt 313.0 lb

## 2024-02-11 DIAGNOSIS — Z139 Encounter for screening, unspecified: Secondary | ICD-10-CM | POA: Diagnosis not present

## 2024-02-11 DIAGNOSIS — Z0000 Encounter for general adult medical examination without abnormal findings: Secondary | ICD-10-CM | POA: Diagnosis not present

## 2024-02-11 NOTE — Patient Instructions (Signed)
 Edward Burton,  Thank you for taking the time for your Medicare Wellness Visit. I appreciate your continued commitment to your health goals. Please review the care plan we discussed, and feel free to reach out if I can assist you further.  Please note that Annual Wellness Visits do not include a physical exam. Some assessments may be limited, especially if the visit was conducted virtually. If needed, we may recommend an in-person follow-up with your provider.  Ongoing Care Seeing your primary care provider every 3 to 6 months helps us  monitor your health and provide consistent, personalized care.   Referrals If a referral was made during today's visit and you haven't received any updates within two weeks, please contact the referred provider directly to check on the status.  Recommended Screenings:  Health Maintenance  Topic Date Due   Complete foot exam   Never done   Pneumococcal Vaccine for age over 98 (2 of 2 - PCV) 11/28/2015   Screening for Lung Cancer  09/01/2016   Zoster (Shingles) Vaccine (2 of 2) 07/05/2021   Flu Shot  08/08/2023   Hemoglobin A1C  08/31/2023   COVID-19 Vaccine (6 - 2025-26 season) 09/08/2023   Eye exam for diabetics  11/05/2023   Kidney health urinalysis for diabetes  03/02/2024   DTaP/Tdap/Td vaccine (2 - Td or Tdap) 12/25/2024   Yearly kidney function blood test for diabetes  01/05/2025   Medicare Annual Wellness Visit  02/10/2025   Colon Cancer Screening  09/03/2028   HPV Vaccine (No Doses Required) Completed   Hepatitis C Screening  Completed   HIV Screening  Completed   Hepatitis B Vaccine  Aged Out   Meningitis B Vaccine  Aged Out       01/06/2024    9:54 AM  Advanced Directives  Does Patient Have a Medical Advance Directive? No  Would patient like information on creating a medical advance directive? No - Patient declined    Vision: Annual vision screenings are recommended for early detection of glaucoma, cataracts, and diabetic  retinopathy. These exams can also reveal signs of chronic conditions such as diabetes and high blood pressure.  Dental: Annual dental screenings help detect early signs of oral cancer, gum disease, and other conditions linked to overall health, including heart disease and diabetes.  Please see the attached documents for additional preventive care recommendations.
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
## 2024-02-17 ENCOUNTER — Ambulatory Visit: Admitting: Family Medicine
